# Patient Record
Sex: Female | Born: 1955 | Hispanic: No | Marital: Married | Smoking: Former smoker
Health system: Southern US, Community
[De-identification: ages and names within clinical notes are randomized; demographics above are authoritative.]

## PROBLEM LIST (undated history)

## (undated) DIAGNOSIS — M549 Dorsalgia, unspecified: Secondary | ICD-10-CM

## (undated) DIAGNOSIS — M21611 Bunion of right foot: Secondary | ICD-10-CM

## (undated) DIAGNOSIS — I10 Essential (primary) hypertension: Secondary | ICD-10-CM

## (undated) DIAGNOSIS — I82409 Acute embolism and thrombosis of unspecified deep veins of unspecified lower extremity: Secondary | ICD-10-CM

## (undated) DIAGNOSIS — K219 Gastro-esophageal reflux disease without esophagitis: Secondary | ICD-10-CM

## (undated) DIAGNOSIS — M1990 Unspecified osteoarthritis, unspecified site: Secondary | ICD-10-CM

## (undated) HISTORY — PX: OTHER SURGICAL HISTORY: SHX169

---

## 2000-07-23 ENCOUNTER — Other Ambulatory Visit: Admission: RE | Admit: 2000-07-23 | Discharge: 2000-07-23 | Payer: Self-pay | Admitting: Gynecology

## 2001-12-16 ENCOUNTER — Other Ambulatory Visit: Admission: RE | Admit: 2001-12-16 | Discharge: 2001-12-16 | Payer: Self-pay | Admitting: Gynecology

## 2003-04-30 ENCOUNTER — Other Ambulatory Visit: Admission: RE | Admit: 2003-04-30 | Discharge: 2003-04-30 | Payer: Self-pay | Admitting: Gynecology

## 2010-10-04 ENCOUNTER — Other Ambulatory Visit: Payer: Self-pay | Admitting: Orthopedic Surgery

## 2010-10-04 ENCOUNTER — Ambulatory Visit (HOSPITAL_BASED_OUTPATIENT_CLINIC_OR_DEPARTMENT_OTHER)
Admission: RE | Admit: 2010-10-04 | Discharge: 2010-10-04 | Disposition: A | Discharge status: Home / Self Care | Payer: Managed Care, Other (non HMO) | Source: Ambulatory Visit | Attending: Orthopedic Surgery | Admitting: Orthopedic Surgery

## 2010-10-04 DIAGNOSIS — Z01812 Encounter for preprocedural laboratory examination: Secondary | ICD-10-CM | POA: Insufficient documentation

## 2010-10-04 DIAGNOSIS — K219 Gastro-esophageal reflux disease without esophagitis: Secondary | ICD-10-CM | POA: Insufficient documentation

## 2010-10-04 DIAGNOSIS — M21619 Bunion of unspecified foot: Secondary | ICD-10-CM | POA: Insufficient documentation

## 2010-10-04 DIAGNOSIS — Z01810 Encounter for preprocedural cardiovascular examination: Secondary | ICD-10-CM | POA: Insufficient documentation

## 2010-10-04 DIAGNOSIS — Q66219 Congenital metatarsus primus varus, unspecified foot: Secondary | ICD-10-CM | POA: Insufficient documentation

## 2010-10-04 DIAGNOSIS — G5760 Lesion of plantar nerve, unspecified lower limb: Secondary | ICD-10-CM | POA: Insufficient documentation

## 2010-10-04 DIAGNOSIS — M545 Low back pain, unspecified: Secondary | ICD-10-CM | POA: Insufficient documentation

## 2010-10-04 DIAGNOSIS — M2010 Hallux valgus (acquired), unspecified foot: Secondary | ICD-10-CM | POA: Insufficient documentation

## 2010-10-04 HISTORY — PX: EXCISION MORTON'S NEUROMA: SHX5013

## 2010-10-04 HISTORY — PX: BUNIONECTOMY: SHX129

## 2010-10-11 NOTE — Op Note (Signed)
NAMEJAMALA, KOHEN               ACCOUNT NO.:  [id number]  MEDICAL RECORD NO.:  [id number]  LOCATION:                               FACILITY:  [facility]  PHYSICIAN:  Marlowe Kays, M.D.       DATE OF BIRTH:  DATE OF PROCEDURE:  10/04/2010 DATE OF DISCHARGE:                              OPERATIVE REPORT   PREOPERATIVE DIAGNOSES: 1. Morton neuroma third-fourth webspace. 2. Painful bunion with metatarsus primus varus and hallux valgus     deformity, all left foot.  POSTOPERATIVE DIAGNOSES: 1. Morton neuroma third-fourth webspace. 2. Painful bunion with metatarsus primus varus and hallux valgus     deformity, all left foot.  OPERATION: 1. Excision of Morton neuroma, third and fourth webspace. 2. FUNK bunionectomy, left foot.  SURGEON:  Marlowe Kays, M.D.  ASSISTANT:  None.  ANESTHESIA:  General.  PATHOLOGY AND JUSTIFICATION FOR PROCEDURE:  Clinically, she had a Morton neuroma third and fourth webspace with marked tenderness in this webspace, none in the second and third.  With her foot, she had a very painful prominent bunion with slightly over 15 degrees of metatarsus primus varus and some slight hallux valgus deformity.  PROCEDURE:  Prophylactic antibiotics, satisfied general anesthesia, pneumatic tourniquet.  Left leg was Esmarch out non-sterilely and tourniquet inflated to 350 mmHg.  Latex precautions utilized.  Left foot and leg were prepped to the midcalf with DuraPrep, draped in sterile field.  I first made a dorsal web-splitting incision to the third and fourth webspace and with blunt section, found a large neuroma present, which I grasped with Allis clamp and freed up the branches into the adjacent toes and then cut the common digital nerve in between the third and fourth metatarsal heads.  There was no apparent bleeding at this time.  I then left the wound open and made a medial incision over the distal first metatarsal extending over the proximal phalanx of  the great toe.  Took care to protect the dorsal sensory nerve.  The capsule was isolated and opened with a flap based distally.  Capsule was quite thin and there was a good bit of synovitis over the dorsum of the metatarsal head, which had some erosion.  With a microsaw, I made my first bunionectomy pass and then trimmed this up with rongeurs, both on the underneath surface and dorsally over the first metatarsal head.  I then measured a centimeter from the articular surface and made a scribe line on the cut bunion bone and then measured a 6 mm proximally.  On the distal cut, I made a transverse osteotomy with a microsaw protecting the overlying-underlying tissues leaving the lateral cortex intact and then made an oblique.  Cut used in a second scribe line removing the small wedge of bone.  I then perforated the lateral cortex with a tiny osteotome and cracked the osteotomy down closing the wedge and just corrected the valgus of the great toe.  I placed some small pieces of cancellous bone in the osteotomy site and after irrigating the wound gently to preserve the bone, closed the flap with a toe and corrected the position with multiple interrupted 0 Vicryl.  I infiltrated all  soft tissues with 0.5% plain Marcaine and closed the skin subcutaneous tissue with interrupted 4-0 nylon mattress sutures.  I then released the tourniquet.  There was no unusual bleeding from a Morton neuroma excision.  I closed this with interrupted 3-0 Vicryl in the subcutaneous tissue and interrupted 4 nylon simple mattress sutures in the skin. Betadine, Adaptic, dry sterile dressing were applied, followed by a well- padded sterile tongue blade along the great toe and first metatarsal. Then finished the dressing with Kling and Coban.  She tolerated the procedure well, was taken to recovery in satisfactory condition with no known complications.          ______________________________ Marlowe Kays,  M.D.     JA/MEDQ  D:  10/04/2010  T:  10/05/2010  Job:  [id number]  Electronically Signed by Marlowe Kays M.D. on 10/11/2010 07:19:50 AM

## 2011-01-04 ENCOUNTER — Other Ambulatory Visit: Payer: Self-pay | Admitting: Physician Assistant

## 2011-01-12 ENCOUNTER — Encounter (HOSPITAL_BASED_OUTPATIENT_CLINIC_OR_DEPARTMENT_OTHER): Payer: Self-pay | Admitting: *Deleted

## 2011-01-12 NOTE — Progress Notes (Signed)
NPO AFTER MN. PT ARRIVES AT 0915. NEEDS HG.

## 2011-01-18 ENCOUNTER — Encounter (HOSPITAL_BASED_OUTPATIENT_CLINIC_OR_DEPARTMENT_OTHER): Payer: Self-pay | Admitting: Anesthesiology

## 2011-01-18 ENCOUNTER — Ambulatory Visit (HOSPITAL_BASED_OUTPATIENT_CLINIC_OR_DEPARTMENT_OTHER): Payer: Managed Care, Other (non HMO) | Admitting: Anesthesiology

## 2011-01-18 ENCOUNTER — Encounter (HOSPITAL_BASED_OUTPATIENT_CLINIC_OR_DEPARTMENT_OTHER): Payer: Self-pay | Admitting: *Deleted

## 2011-01-18 ENCOUNTER — Encounter (HOSPITAL_BASED_OUTPATIENT_CLINIC_OR_DEPARTMENT_OTHER)
Admission: RE | Disposition: A | Discharge status: Home / Self Care | Payer: Self-pay | Source: Ambulatory Visit | Attending: Orthopedic Surgery

## 2011-01-18 ENCOUNTER — Ambulatory Visit (HOSPITAL_BASED_OUTPATIENT_CLINIC_OR_DEPARTMENT_OTHER)
Admission: RE | Admit: 2011-01-18 | Discharge: 2011-01-18 | Disposition: A | Discharge status: Home / Self Care | Payer: Managed Care, Other (non HMO) | Source: Ambulatory Visit | Attending: Orthopedic Surgery | Admitting: Orthopedic Surgery

## 2011-01-18 DIAGNOSIS — M21619 Bunion of unspecified foot: Secondary | ICD-10-CM | POA: Insufficient documentation

## 2011-01-18 DIAGNOSIS — Z9889 Other specified postprocedural states: Secondary | ICD-10-CM

## 2011-01-18 DIAGNOSIS — K219 Gastro-esophageal reflux disease without esophagitis: Secondary | ICD-10-CM | POA: Insufficient documentation

## 2011-01-18 HISTORY — PX: BUNIONECTOMY: SHX129

## 2011-01-18 HISTORY — DX: Dorsalgia, unspecified: M54.9

## 2011-01-18 HISTORY — DX: Unspecified osteoarthritis, unspecified site: M19.90

## 2011-01-18 HISTORY — DX: Bunion of right foot: M21.611

## 2011-01-18 HISTORY — DX: Gastro-esophageal reflux disease without esophagitis: K21.9

## 2011-01-18 LAB — POCT HEMOGLOBIN-HEMACUE: Hemoglobin: 14.5 g/dL (ref 12.0–15.0)

## 2011-01-18 SURGERY — BUNIONECTOMY
Anesthesia: General | Site: Foot | Laterality: Right | Wound class: Clean

## 2011-01-18 MED ORDER — LIDOCAINE HCL (CARDIAC) 20 MG/ML IV SOLN
INTRAVENOUS | Status: DC | PRN
  Administered 2011-01-18: 100 mg via INTRAVENOUS

## 2011-01-18 MED ORDER — FENTANYL CITRATE 0.05 MG/ML IJ SOLN
INTRAMUSCULAR | Status: DC | PRN
  Administered 2011-01-18: 100 ug via INTRAVENOUS
  Administered 2011-01-18 (×2): 50 ug via INTRAVENOUS

## 2011-01-18 MED ORDER — LACTATED RINGERS IV SOLN: INTRAVENOUS | Status: DC

## 2011-01-18 MED ORDER — SODIUM CHLORIDE 0.9 % IV SOLN: INTRAVENOUS | Status: DC

## 2011-01-18 MED ORDER — FENTANYL CITRATE 0.05 MG/ML IJ SOLN: 25.0000 ug | INTRAMUSCULAR | Status: DC | PRN

## 2011-01-18 MED ORDER — PROMETHAZINE HCL 25 MG/ML IJ SOLN: 6.2500 mg | INTRAMUSCULAR | Status: DC | PRN

## 2011-01-18 MED ORDER — OXYCODONE-ACETAMINOPHEN 7.5-325 MG PO TABS: 1.0000 | ORAL_TABLET | ORAL | Status: AC | PRN

## 2011-01-18 MED ORDER — ONDANSETRON HCL 4 MG/2ML IJ SOLN
INTRAMUSCULAR | Status: DC | PRN
  Administered 2011-01-18: 4 mg via INTRAVENOUS

## 2011-01-18 MED ORDER — LACTATED RINGERS IV SOLN
INTRAVENOUS | Status: DC
  Administered 2011-01-18: 100 mL/h via INTRAVENOUS

## 2011-01-18 MED ORDER — MIDAZOLAM HCL 5 MG/5ML IJ SOLN
INTRAMUSCULAR | Status: DC | PRN
  Administered 2011-01-18: 2 mg via INTRAVENOUS

## 2011-01-18 MED ORDER — DEXAMETHASONE SODIUM PHOSPHATE 4 MG/ML IJ SOLN
INTRAMUSCULAR | Status: DC | PRN
  Administered 2011-01-18: 4 mg via INTRAVENOUS

## 2011-01-18 MED ORDER — LACTATED RINGERS IV SOLN
INTRAVENOUS | Status: DC | PRN
  Administered 2011-01-18: 10:00:00 via INTRAVENOUS

## 2011-01-18 MED ORDER — KETOROLAC TROMETHAMINE 30 MG/ML IJ SOLN
INTRAMUSCULAR | Status: DC | PRN
  Administered 2011-01-18: 30 mg via INTRAVENOUS

## 2011-01-18 MED ORDER — PROPOFOL 10 MG/ML IV EMUL
INTRAVENOUS | Status: DC | PRN
  Administered 2011-01-18: 180 mg via INTRAVENOUS

## 2011-01-18 MED ORDER — BUPIVACAINE HCL (PF) 0.25 % IJ SOLN
INTRAMUSCULAR | Status: DC | PRN
  Administered 2011-01-18: 3 mL

## 2011-01-18 MED ORDER — CHLORHEXIDINE GLUCONATE 4 % EX LIQD: 60.0000 mL | Freq: Once | CUTANEOUS | Status: DC

## 2011-01-18 SURGICAL SUPPLY — 50 items
BANDAGE COBAN STERILE 4 (GAUZE/BANDAGES/DRESSINGS) ×2 IMPLANT
BANDAGE CONFORM 3  STR LF (GAUZE/BANDAGES/DRESSINGS) ×2 IMPLANT
BANDAGE ESMARK 6X9 LF (GAUZE/BANDAGES/DRESSINGS) ×1 IMPLANT
BANDAGE GAUZE ELAST BULKY 4 IN (GAUZE/BANDAGES/DRESSINGS) IMPLANT
BLADE FLAT COURSE (BLADE) ×2 IMPLANT
BLADE FLAT FINE (BLADE) ×2 IMPLANT
BLADE SURG 15 STRL LF DISP TIS (BLADE) ×2 IMPLANT
BLADE SURG 15 STRL SS (BLADE) ×2
BNDG COHESIVE 3X5 TAN STRL LF (GAUZE/BANDAGES/DRESSINGS) IMPLANT
BNDG COHESIVE 4X5 TAN NS LF (GAUZE/BANDAGES/DRESSINGS) IMPLANT
BNDG ESMARK 6X9 LF (GAUZE/BANDAGES/DRESSINGS) ×2
CLOTH BEACON ORANGE TIMEOUT ST (SAFETY) ×2 IMPLANT
CORDS BIPOLAR (ELECTRODE) ×2 IMPLANT
COVER TABLE BACK 60X90 (DRAPES) ×2 IMPLANT
DEPRESSOR TONGUE BLADE STERILE (MISCELLANEOUS) ×2 IMPLANT
DRAPE EXTREMITY T 121X128X90 (DRAPE) ×2 IMPLANT
DRAPE LG THREE QUARTER DISP (DRAPES) ×2 IMPLANT
DRAPE U-SHAPE 47X51 STRL (DRAPES) ×2 IMPLANT
DRSG EMULSION OIL 3X3 NADH (GAUZE/BANDAGES/DRESSINGS) ×2 IMPLANT
DURAPREP 26ML APPLICATOR (WOUND CARE) ×2 IMPLANT
ELECT REM PT RETURN 9FT ADLT (ELECTROSURGICAL) ×2
ELECTRODE REM PT RTRN 9FT ADLT (ELECTROSURGICAL) ×1 IMPLANT
GAUZE KERLIX 2  STERILE LF (GAUZE/BANDAGES/DRESSINGS) ×2 IMPLANT
GLOVE ECLIPSE 8.0 STRL XLNG CF (GLOVE) IMPLANT
GLOVE INDICATOR 6.5 STRL GRN (GLOVE) ×4 IMPLANT
GLOVE INDICATOR 8.0 STRL GRN (GLOVE) ×4 IMPLANT
GOWN PREVENTION PLUS LG XLONG (DISPOSABLE) ×2 IMPLANT
GOWN STRL REIN XL XLG (GOWN DISPOSABLE) ×2 IMPLANT
LOOP VESSEL MAXI BLUE (MISCELLANEOUS) IMPLANT
NEEDLE HYPO 22GX1.5 SAFETY (NEEDLE) ×2 IMPLANT
NEEDLE KEITH (NEEDLE) IMPLANT
NS IRRIG 500ML POUR BTL (IV SOLUTION) ×2 IMPLANT
PACK BASIN DAY SURGERY FS (CUSTOM PROCEDURE TRAY) ×2 IMPLANT
PAD CAST 4YDX4 CTTN HI CHSV (CAST SUPPLIES) ×1 IMPLANT
PADDING CAST ABS 4INX4YD NS (CAST SUPPLIES) ×1
PADDING CAST ABS COTTON 4X4 ST (CAST SUPPLIES) ×1 IMPLANT
PADDING CAST COTTON 4X4 STRL (CAST SUPPLIES) ×1
PENCIL BUTTON HOLSTER BLD 10FT (ELECTRODE) ×2 IMPLANT
SPONGE GAUZE 4X4 12PLY (GAUZE/BANDAGES/DRESSINGS) ×2 IMPLANT
SPONGE LAP 4X18 X RAY DECT (DISPOSABLE) ×2 IMPLANT
STOCKINETTE 4X48 STRL (DRAPES) IMPLANT
STOCKINETTE 6  STRL (DRAPES) ×1
STOCKINETTE 6 STRL (DRAPES) ×1 IMPLANT
SUCTION FRAZIER TIP 10 FR DISP (SUCTIONS) ×2 IMPLANT
SUT ETHILON 4 0 PS 2 18 (SUTURE) ×4 IMPLANT
SUT VIC AB 0 CT1 36 (SUTURE) ×2 IMPLANT
SYR BULB IRRIGATION 50ML (SYRINGE) ×2 IMPLANT
SYR CONTROL 10ML LL (SYRINGE) ×2 IMPLANT
UNDERPAD 30X30 INCONTINENT (UNDERPADS AND DIAPERS) ×2 IMPLANT
WATER STERILE IRR 500ML POUR (IV SOLUTION) ×2 IMPLANT

## 2011-01-18 NOTE — Anesthesia Preprocedure Evaluation (Addendum)
Anesthesia Evaluation  Patient identified by MRN, date of birth, ID band Patient awake    Reviewed: Allergy & Precautions, H&P , NPO status , Patient's Chart, lab work & pertinent test results  Airway Mallampati: I TM Distance: >3 FB Neck ROM: full    Dental No notable dental hx. (+) Teeth Intact and Dental Advidsory Given   Pulmonary neg pulmonary ROS,  clear to auscultation  Pulmonary exam normal       Cardiovascular Exercise Tolerance: Good neg cardio ROS regular Normal    Neuro/Psych Negative Neurological ROS  Negative Psych ROS   GI/Hepatic negative GI ROS, Neg liver ROS, GERD-  ,  Endo/Other  Negative Endocrine ROS  Renal/GU negative Renal ROS  Genitourinary negative   Musculoskeletal   Abdominal Normal abdominal exam  (+)   Peds  Hematology negative hematology ROS (+)   Anesthesia Other Findings   Reproductive/Obstetrics negative OB ROS                          Anesthesia Physical Anesthesia Plan  ASA: I  Anesthesia Plan: General LMA   Post-op Pain Management:    Induction:   Airway Management Planned:   Additional Equipment:   Intra-op Plan:   Post-operative Plan:   Informed Consent: I have reviewed the patients History and Physical, chart, labs and discussed the procedure including the risks, benefits and alternatives for the proposed anesthesia with the patient or authorized representative who has indicated his/her understanding and acceptance.   Dental Advisory Given  Plan Discussed with: CRNA  Anesthesia Plan Comments:         Anesthesia Quick Evaluation

## 2011-01-18 NOTE — Anesthesia Procedure Notes (Signed)
Procedure Name: LMA Insertion Date/Time: 01/18/2011 10:51 AM Performed by: Iline Oven Pre-anesthesia Checklist: Patient identified, Emergency Drugs available, Suction available and Patient being monitored Patient Re-evaluated:Patient Re-evaluated prior to inductionOxygen Delivery Method: Circle System Utilized Preoxygenation: Pre-oxygenation with 100% oxygen Intubation Type: IV induction Ventilation: Mask ventilation without difficulty LMA: LMA inserted LMA Size: 4.0 Number of attempts: 1 Airway Equipment and Method: bite block Placement Confirmation: positive ETCO2 Tube secured with: Tape Dental Injury: Teeth and Oropharynx as per pre-operative assessment

## 2011-01-18 NOTE — Brief Op Note (Signed)
01/18/2011  11:36 AM  PATIENT:  Mikayla French  55 y.o. female  PRE-OPERATIVE DIAGNOSIS:  BUNION RIGHT FOOT  POST-OPERATIVE DIAGNOSIS:  painful bunion right foot   PROCEDURE:  Procedure(s): Simple Bunionectomy Right foot  SURGEON:  Surgeon(s): James P Aplington  PHYSICIAN ASSISTANT: None  ASSISTANTS: none   ANESTHESIA:   general  EBL:  Total I/O In: 100 [I.V.:100] Out: -   BLOOD ADMINISTERED:none  DRAINS: none   LOCAL MEDICATIONS USED:  MARCAINE 3CC  SPECIMEN:  No Specimen  DISPOSITION OF SPECIMEN:  N/A  COUNTS:  YES  TOURNIQUET:   Total Tourniquet Time Documented: Calf (Right) - 27 minutes  DICTATION: .Other Dictation: Dictation Number [id number]  PLAN OF CARE: Discharge to home after PACU  PATIENT DISPOSITION:  PACU - hemodynamically stable.

## 2011-01-18 NOTE — Anesthesia Postprocedure Evaluation (Signed)
Anesthesia Post Note  Patient: Mikayla French  Procedure(s) Performed:  BUNIONECTOMY  Anesthesia type: General  Patient location: PACU  Post pain: Pain level controlled  Post assessment: Post-op Vital signs reviewed  Last Vitals:  Filed Vitals:   01/18/11 1200  BP: 137/75  Pulse: 70  Temp:   Resp: 8    Post vital signs: Reviewed  Level of consciousness: sedated  Complications: No apparent anesthesia complications

## 2011-01-18 NOTE — H&P (Signed)
Mikayla French is an 55 y.o. female.   Chief Complaint: painful bunion  Rt foot HPI Prominent tender bunion rt foot s/p bunionectomy Lt foot  Past Medical History  Diagnosis Date  . Bunion of right foot   . Back pain occasional    post decompression w/ chiropractor --  w/ good improvement  . Arthritis   . GERD (gastroesophageal reflux disease) occ. reflux    watches diet/  takes pickle juice    Past Surgical History  Procedure Date  . Removal baker's cyst right knee 6 YRS AGO  . Excision morton's neuroma 10-04-2010    LEFT --  THIRD- FOURTH WEBSPACE  . Bunionectomy 10-04-2010    LEFT FOOT    History reviewed. No pertinent family history. Social History:  reports that she quit smoking about 5 years ago. Her smoking use included Cigarettes. She has never used smokeless tobacco. She reports that she drinks alcohol. She reports that she does not use illicit drugs.  Allergies:  Allergies  Allergen Reactions  . Latex Anaphylaxis    blisters  . Adhesive (Tape) Other (See Comments)    blisters  . Ibuprofen Other (See Comments)    Gl bleeding  . Nitrofuran Derivatives Itching  . Penicillins Swelling    Medications Prior to Admission  Medication Dose Route Frequency Provider Last Rate Last Dose  . 0.9 %  sodium chloride infusion   Intravenous Continuous Lottie Dawson III, PA      . chlorhexidine (HIBICLENS) 4 % liquid 4 application  60 mL Topical Once Lottie Dawson III, PA      . lactated ringers infusion   Intravenous Continuous Gaetano Hawthorne, MD 100 mL/hr at 01/18/11 0923 100 mL/hr at 01/18/11 1610   Medications Prior to Admission  Medication Sig Dispense Refill  . Multiple Vitamins-Minerals (MULTI-VITAMIN MENOPAUSAL) TABS Take 2 tablets by mouth once.        . Nutritional Supplements (MELATONIN PO) Take by mouth as needed.          Results for orders placed during the hospital encounter of 01/18/11 (from the past 48 hour(s))  POCT HEMOGLOBIN-HEMACUE     Status:  Normal   Collection Time   01/18/11  9:26 AM      Component Value Range Comment   Hemoglobin 14.5  12.0 - 15.0 (g/dL)    No results found.  ROS  Blood pressure 126/79, pulse 65, temperature 97 F (36.1 C), temperature source Oral, resp. rate 18, height 5\' 5"  (1.651 m), weight 77.111 kg (170 lb), SpO2 96.00%. Physical Exam  Constitutional: She is oriented to person, place, and time. She appears well-developed and well-nourished.  HENT:  Head: Normocephalic and atraumatic.  Right Ear: External ear normal.  Left Ear: External ear normal.  Nose: Nose normal.  Mouth/Throat: Oropharynx is clear and moist.  Eyes: Conjunctivae and EOM are normal. Pupils are equal, round, and reactive to light.  Neck: Normal range of motion. Neck supple.  Cardiovascular: Normal rate, regular rhythm, normal heart sounds and intact distal pulses.   Respiratory: Effort normal and breath sounds normal.  GI: Soft. Bowel sounds are normal.  Musculoskeletal: Normal range of motion. She exhibits tenderness.       Tender bunion rt foot  Neurological: She is alert and oriented to person, place, and time. She has normal reflexes.  Skin: Skin is warm and dry.  Psychiatric: She has a normal mood and affect. Her behavior is normal. Judgment and thought content normal.  Assessment/Plan Painful bunion Rt foot Simple bunionectomy Rt foot  Marland Reine P 01/18/2011, 10:26 AM

## 2011-01-18 NOTE — Transfer of Care (Signed)
Immediate Anesthesia Transfer of Care Note  Patient: Mikayla French  Procedure(s) Performed:  BUNIONECTOMY  Patient Location: PACU  Anesthesia Type: General  Level of Consciousness: awake, sedated, patient cooperative and responds to stimulation  Airway & Oxygen Therapy: Patient Spontanous Breathing and Patient connected to face mask oxygen  Post-op Assessment: Report given to PACU RN, Post -op Vital signs reviewed and stable and Patient moving all extremities  Post vital signs: Reviewed and stable  Complications: No apparent anesthesia complications

## 2011-01-19 ENCOUNTER — Encounter (HOSPITAL_BASED_OUTPATIENT_CLINIC_OR_DEPARTMENT_OTHER): Payer: Self-pay | Admitting: Orthopedic Surgery

## 2011-01-19 NOTE — Op Note (Signed)
Mikayla French, VINLUAN               ACCOUNT NO.:  [id number]  MEDICAL RECORD NO.:  [id number]  LOCATION:                               FACILITY:  [facility]  PHYSICIAN:  Marlowe Kays, M.D.  DATE OF BIRTH:  [birth year]  DATE OF PROCEDURE:  01/18/2011 DATE OF DISCHARGE:                              OPERATIVE REPORT   PREOPERATIVE DIAGNOSIS:  Painful bunion, right foot.  POSTOPERATIVE DIAGNOSIS:  Painful bunion, right foot.  OPERATION:  Simple bunionectomy, right foot.  SURGEON:  Marlowe Kays, M.D.  ASSISTANT:  Nurse.  ANESTHESIA:  General.  PATHOLOGIC JUSTIFICATION FOR PROCEDURE:  She has had a successful bunionectomy on the left foot; has a bunion deformity on the right foot, which is giving her pain and problems with shoe wearing, She does not have metatarsus primus varus in her right foot.  It was felt that a simple bunionectomy would be the treatment of choice.  PROCEDURE IN DETAIL:  Satisfied general anesthesia, pneumatic tourniquet, right foot and ankle were prepped with DuraPrep, draped in a sterile field, latex precautions, time-out performed.  Tourniquet was inflated after elevating the leg for about 5 minutes.  I made a dorsomedial incision, centered over the bunion, but extending proximally and distalward.  The underlying capsule of the MP joint was identified and.  Dorsal sensory nerve was protected.  The capsule was opened the flap base distally to expose both the bunion and a small dorsal component.  I excised the bunion and the dorsal component with a combination of osteotome and rongeur until she had a nice visual correction.  Wound was irrigated with sterile saline and soft tissues infiltrated with 0.25% Marcaine plain.  I then closed the wound with the great toe in a corrected position and tightening the flap proximalward with interrupted 0 Vicryl suture.  Skin and subcutaneous tissue were closed with interrupted 4-0 nylon mattress sutures.  Betadine,  Adaptic, dry sterile dressing were applied.  Tourniquet was released.  She tolerated the procedure well.  At the time of this dictation, she was on her way to the recovery room in satisfactory condition with no known complications and no blood loss.         ______________________________ Marlowe Kays, M.D.    JA/MEDQ  D:  01/18/2011  T:  01/19/2011  Job:  [id number]

## 2011-01-31 DIAGNOSIS — R319 Hematuria, unspecified: Secondary | ICD-10-CM | POA: Insufficient documentation

## 2011-03-28 DIAGNOSIS — N390 Urinary tract infection, site not specified: Secondary | ICD-10-CM | POA: Insufficient documentation

## 2016-02-07 DIAGNOSIS — S83231A Complex tear of medial meniscus, current injury, right knee, initial encounter: Secondary | ICD-10-CM | POA: Insufficient documentation

## 2017-04-21 DIAGNOSIS — E039 Hypothyroidism, unspecified: Secondary | ICD-10-CM | POA: Insufficient documentation

## 2018-01-25 DIAGNOSIS — J309 Allergic rhinitis, unspecified: Secondary | ICD-10-CM | POA: Insufficient documentation

## 2018-01-25 DIAGNOSIS — N809 Endometriosis, unspecified: Secondary | ICD-10-CM | POA: Insufficient documentation

## 2018-01-25 DIAGNOSIS — IMO0002 Reserved for concepts with insufficient information to code with codable children: Secondary | ICD-10-CM | POA: Insufficient documentation

## 2018-03-19 DIAGNOSIS — H02834 Dermatochalasis of left upper eyelid: Secondary | ICD-10-CM | POA: Insufficient documentation

## 2018-03-19 DIAGNOSIS — H04123 Dry eye syndrome of bilateral lacrimal glands: Secondary | ICD-10-CM | POA: Insufficient documentation

## 2018-03-19 DIAGNOSIS — H02831 Dermatochalasis of right upper eyelid: Secondary | ICD-10-CM | POA: Insufficient documentation

## 2018-03-19 DIAGNOSIS — H02883 Meibomian gland dysfunction of right eye, unspecified eyelid: Secondary | ICD-10-CM | POA: Insufficient documentation

## 2018-03-19 DIAGNOSIS — H2513 Age-related nuclear cataract, bilateral: Secondary | ICD-10-CM | POA: Insufficient documentation

## 2018-03-19 DIAGNOSIS — E119 Type 2 diabetes mellitus without complications: Secondary | ICD-10-CM | POA: Insufficient documentation

## 2018-03-19 DIAGNOSIS — H35033 Hypertensive retinopathy, bilateral: Secondary | ICD-10-CM | POA: Insufficient documentation

## 2018-03-19 DIAGNOSIS — H0100A Unspecified blepharitis right eye, upper and lower eyelids: Secondary | ICD-10-CM | POA: Insufficient documentation

## 2018-05-24 DIAGNOSIS — R7989 Other specified abnormal findings of blood chemistry: Secondary | ICD-10-CM | POA: Insufficient documentation

## 2018-09-22 ENCOUNTER — Emergency Department (INDEPENDENT_AMBULATORY_CARE_PROVIDER_SITE_OTHER)
Admission: EM | Admit: 2018-09-22 | Discharge: 2018-09-22 | Disposition: A | Discharge status: Home / Self Care | Source: Home / Self Care | Attending: Family Medicine | Admitting: Family Medicine

## 2018-09-22 ENCOUNTER — Other Ambulatory Visit: Payer: Self-pay

## 2018-09-22 DIAGNOSIS — R300 Dysuria: Secondary | ICD-10-CM

## 2018-09-22 DIAGNOSIS — N3090 Cystitis, unspecified without hematuria: Secondary | ICD-10-CM

## 2018-09-22 LAB — POCT URINALYSIS DIP (MANUAL ENTRY)
Bilirubin, UA: NEGATIVE
Glucose, UA: NEGATIVE mg/dL
Ketones, POC UA: NEGATIVE mg/dL
Nitrite, UA: POSITIVE — AB
Spec Grav, UA: 1.025 (ref 1.010–1.025)
Urobilinogen, UA: 0.2 E.U./dL
pH, UA: 5.5 (ref 5.0–8.0)

## 2018-09-22 MED ORDER — CIPROFLOXACIN HCL 500 MG PO TABS: 500.0000 mg | ORAL_TABLET | Freq: Two times a day (BID) | ORAL | 0 refills | Status: DC

## 2018-09-22 NOTE — ED Triage Notes (Signed)
Pt c/o burning with urination as well more frequency with difficulty completely voiding bladder x 3 days.

## 2018-09-22 NOTE — ED Provider Notes (Signed)
[facility]    CSN: [id number] Arrival date & time: 09/22/18  1413      History   Chief Complaint Chief Complaint  Patient presents with  . Dysuria  . Urinary Frequency    HPI Mikayla French is a 63 y.o. female.   Patient complains of four day history of dysuria, frequency, hesitancy, and urgency.  She denies abdominal/pelvic pain and no fevers, chills, and sweats.  The history is provided by the patient.  Dysuria Pain quality:  Burning Pain severity:  Mild Onset quality:  Sudden Duration:  4 days Timing:  Constant Progression:  Worsening Chronicity:  New Recent urinary tract infections: no   Relieved by:  None tried Worsened by:  Nothing Ineffective treatments:  None tried Urinary symptoms: frequent urination and hesitancy   Urinary symptoms: no discolored urine, no foul-smelling urine, no hematuria and no bladder incontinence   Associated symptoms: no abdominal pain, no fever, no flank pain, no nausea and no vaginal discharge     Past Medical History:  Diagnosis Date  . Arthritis   . Back pain occasional   post decompression w/ chiropractor --  w/ good improvement  . Bunion of right foot   . GERD (gastroesophageal reflux disease) occ. reflux   watches diet/  takes pickle juice    There are no active problems to display for this patient.   Past Surgical History:  Procedure Laterality Date  . BUNIONECTOMY  10-04-2010   LEFT FOOT  . BUNIONECTOMY  01/18/2011   Procedure: Arbutus LeasBUNIONECTOMY;  Surgeon: Illene LabradorJames P Aplington;  Location: [facility];  Service: Orthopedics;  Laterality: Right;  . EXCISION MORTON'S NEUROMA  10-04-2010   LEFT --  THIRD- FOURTH WEBSPACE  . REMOVAL BAKER'S CYST RIGHT KNEE  6 YRS AGO    OB History   No obstetric history on file.      Home Medications    Prior to Admission medications   Medication Sig Start Date End Date Taking? Authorizing Provider  ciprofloxacin (CIPRO) 500 MG tablet Take 1 tablet (500 mg  total) by mouth 2 (two) times daily. 09/22/18   Lattie HawBeese, Yousaf Sainato A, MD  Multiple Vitamins-Minerals (MULTI-VITAMIN MENOPAUSAL) TABS Take 2 tablets by mouth once.      [provider]  Nutritional Supplements (MELATONIN PO) Take by mouth as needed.      [provider]    Family History History reviewed. No pertinent family history.  Social History Social History   Tobacco Use  . Smoking status: Former Smoker    Types: Cigarettes    Quit date: 01/11/2006    Years since quitting: 12.7  . Smokeless tobacco: Never Used  Substance Use Topics  . Alcohol use: Yes    Comment: 1 WINE/ DAILY  . Drug use: No     Allergies   Latex, Adhesive [tape], Ibuprofen, Nitrofuran derivatives, and Penicillins   Review of Systems Review of Systems  Constitutional: Negative for fever.  Gastrointestinal: Negative for abdominal pain and nausea.  Genitourinary: Positive for dysuria, frequency and urgency. Negative for decreased urine volume, difficulty urinating, flank pain, hematuria, pelvic pain and vaginal discharge.  All other systems reviewed and are negative.    Physical Exam Triage Vital Signs ED Triage Vitals  Enc Vitals Group     BP 09/22/18 1455 124/80     Pulse Rate 09/22/18 1455 86     Resp 09/22/18 1455 18     Temp 09/22/18 1455 (!) 97.4 F (36.3 C)  Temp Source 09/22/18 1455 Oral     SpO2 09/22/18 1455 95 %     Weight 09/22/18 1457 179 lb (81.2 kg)     Height 09/22/18 1457 5\' 5"  (1.651 m)     Head Circumference --      Peak Flow --      Pain Score 09/22/18 1457 0     Pain Loc --      Pain Edu? --      Excl. in [place]? --    No data found.  Updated Vital Signs BP 124/80 (BP Location: Right Arm)   Pulse 86   Temp (!) 97.4 F (36.3 C) (Oral)   Resp 18   Ht 5\' 5"  (1.651 m)   Wt 81.2 kg   SpO2 95%   BMI 29.79 kg/m   Visual Acuity Right Eye Distance:   Left Eye Distance:   Bilateral Distance:    Right Eye Near:   Left Eye Near:    Bilateral Near:      Physical Exam Nursing notes and Vital Signs reviewed. Appearance:  Patient appears stated age, and in no acute distress.    Eyes:  Pupils are equal, round, and reactive to light and accomodation.  Extraocular movement is intact.  Conjunctivae are not inflamed   Pharynx:  Normal; moist mucous membranes  Neck:  Supple.  No adenopathy Lungs:  Clear to auscultation.  Breath sounds are equal.  Moving air well. Heart:  Regular rate and rhythm without murmurs, rubs, or gallops.  Abdomen:  Nontender without masses or hepatosplenomegaly.  Bowel sounds are present.  No CVA or flank tenderness.  Extremities:  No edema.  Skin:  No rash present.     UC Treatments / Results  Labs (all labs ordered are listed, but only abnormal results are displayed) Labs Reviewed  POCT URINALYSIS DIP (MANUAL ENTRY) - Abnormal; Notable for the following components:      Result Value   Clarity, UA cloudy (*)    Blood, UA small (*)    Protein Ur, POC trace (*)    Nitrite, UA Positive (*)    Leukocytes, UA Large (3+) (*)    All other components within normal limits  URINE CULTURE    EKG   Radiology No results found.  Procedures Procedures (including critical care time)  Medications Ordered in UC Medications - No data to display  Initial Impression / Assessment and Plan / UC Course  I have reviewed the triage vital signs and the nursing notes.  Pertinent labs & imaging results that were available during my care of the patient were reviewed by me and considered in my medical decision making (see chart for details).    Begin Cipro.  Urine culture pending. Followup with Family Doctor if not improved in 10 days.   Final Clinical Impressions(s) / UC Diagnoses   Final diagnoses:  Dysuria  Cystitis     Discharge Instructions     Increase fluid intake. May use non-prescription AZO for about two days, if desired, to decrease urinary discomfort.  If symptoms become significantly worse during the  night or over the weekend, proceed to the local emergency room.     ED Prescriptions    Medication Sig Dispense Auth. Provider   ciprofloxacin (CIPRO) 500 MG tablet Take 1 tablet (500 mg total) by mouth 2 (two) times daily. 20 tablet Kandra Nicolas, MD        Kandra Nicolas, MD 09/23/18 [id number]

## 2018-09-22 NOTE — Discharge Instructions (Addendum)
Increase fluid intake. May use non-prescription AZO for about two days, if desired, to decrease urinary discomfort.  If symptoms become significantly worse during the night or over the weekend, proceed to the local emergency room.  

## 2018-09-24 ENCOUNTER — Telehealth (HOSPITAL_COMMUNITY): Payer: Self-pay | Admitting: Emergency Medicine

## 2018-09-24 LAB — URINE CULTURE
MICRO NUMBER:: 828560
SPECIMEN QUALITY:: ADEQUATE

## 2018-09-24 NOTE — Telephone Encounter (Signed)
Urine culture was positive for e coli and was given cipro  at urgent care visit. Pt contacted and made aware, educated on completing antibiotic and to follow up if symptoms are persistent. Verbalized understanding.    

## 2018-10-11 DIAGNOSIS — G629 Polyneuropathy, unspecified: Secondary | ICD-10-CM | POA: Insufficient documentation

## 2019-08-28 ENCOUNTER — Ambulatory Visit (INDEPENDENT_AMBULATORY_CARE_PROVIDER_SITE_OTHER): Admitting: Osteopathic Medicine

## 2019-08-28 ENCOUNTER — Encounter: Payer: Self-pay | Admitting: Osteopathic Medicine

## 2019-08-28 ENCOUNTER — Other Ambulatory Visit: Payer: Self-pay

## 2019-08-28 ENCOUNTER — Ambulatory Visit (INDEPENDENT_AMBULATORY_CARE_PROVIDER_SITE_OTHER)

## 2019-08-28 VITALS — BP 118/76 | HR 75 | Ht 67.0 in | Wt 181.0 lb

## 2019-08-28 DIAGNOSIS — M545 Low back pain, unspecified: Secondary | ICD-10-CM

## 2019-08-28 DIAGNOSIS — M546 Pain in thoracic spine: Secondary | ICD-10-CM | POA: Diagnosis not present

## 2019-08-28 DIAGNOSIS — G8929 Other chronic pain: Secondary | ICD-10-CM

## 2019-08-28 DIAGNOSIS — Z87891 Personal history of nicotine dependence: Secondary | ICD-10-CM

## 2019-08-28 DIAGNOSIS — R7303 Prediabetes: Secondary | ICD-10-CM | POA: Diagnosis not present

## 2019-08-28 DIAGNOSIS — E039 Hypothyroidism, unspecified: Secondary | ICD-10-CM | POA: Diagnosis not present

## 2019-08-28 NOTE — Progress Notes (Signed)
Mikayla French is a 64 y.o. female who presents to  [facility] at [facility]  today, 08/28/19, seeking care for the following: New to establish care  . Mouth broken out after Cipro Rx for UTI, seems to have resolved at this point . Back pain - mid-back and lumbar area, has been seeing chiropractor, using a decompression device which was helpful up until a couple of months ago.  Requests x-rays, patient does not think that she would be adherent to PT regimen . Thyroid problem - see problem list  . Hx DM2 on chart - last A1C available for review was 6.0 06/05/19, prior to that 5.7 four years ago      ASSESSMENT & PLAN with other pertinent findings:  The primary encounter diagnosis was Chronic bilateral low back pain without sciatica. Diagnoses of Chronic bilateral thoracic back pain, Acquired hypothyroidism, Prediabetes, and Former smoker were also pertinent to this visit.   No red flag symptoms for back pain particularly no significant radiculopathy, incontinence, falls/weakness.  Normal exam, including negative straight leg raise bilaterally.  X-rays as below, advised okay to continue chiropractics, recommended PT  No results found for this or any previous visit (from the past 24 hour(s)).   There are no Patient Instructions on file for this visit.  Orders Placed This Encounter  Procedures  . DG Thoracic Spine W/Swimmers  . DG Lumbar Spine Complete  . DG Si Joints  . CT CHEST LUNG CA SCREEN LOW DOSE W/O CM    No orders of the defined types were placed in this encounter.      Follow-up instructions: Return for RECHECK PENDING RESULTS / IF WORSE OR CHANGE. IF FEELING OK, ANNUAL CHECK-UP IN 1 YEAR .                                         BP 118/76 (BP Location: Left Arm, Patient Position: Sitting)   Pulse 75   Ht 5\' 7"  (1.702 m)   Wt 181 lb (82.1 kg)   SpO2 98%   BMI 28.35 kg/m   Current  Meds  Medication Sig  . ACIDOPHILUS LACTOBACILLUS PO Take by mouth.  b complex vitamins capsule Take by mouth.  . Cholecalciferol 1.25 MG (50000 UT) TABS Take by mouth.  . clobetasol cream (TEMOVATE) 0.05 % Use as directed bid  . conjugated estrogens (PREMARIN) vaginal cream USE ONE-HALF (1/2) GRAM VAGINALLY 2 TO 3 NIGHTS A WEEK  . Multiple Vitamin (MULTIVITAMIN) capsule Take 1 capsule by mouth every morning.  . Multiple Vitamins-Minerals (MULTI-VITAMIN MENOPAUSAL) TABS Take 2 tablets by mouth once.    . NP THYROID 30 MG tablet Take 30 mg by mouth daily.  . Nutritional Supplements (MELATONIN PO) Take by mouth as needed.    . phentermine (ADIPEX-P) 37.5 MG tablet Take by mouth.  . Pregnenolone POWD by Does not apply route.  Marland Kitchen PREMARIN vaginal cream Place vaginally.  Marland Kitchen thyroid (ARMOUR) 15 MG tablet Take one (1) 15 mg + one (1) 30 mg tablet for (45 mg Total) QOD  Alternating with (30 mg Total) QOD  . topiramate (TOPAMAX) 25 MG tablet Take by mouth.    No results found for this or any previous visit (from the past 72 hour(s)).  DG Thoracic Spine W/Swimmers  Result Date: 08/28/2019 CLINICAL DATA:  Back pain EXAM: THORACIC SPINE - 3 VIEWS COMPARISON:  None. FINDINGS: There is no evidence of thoracic spine fracture. Alignment is normal. Very mild multilevel endplate sclerosis is seen with mild multilevel intervertebral disc space narrowing. IMPRESSION: Mild multilevel degenerative changes. Electronically Signed   By: Aram Candela M.D.   On: 08/28/2019 21:04   DG Lumbar Spine Complete  Result Date: 08/28/2019 CLINICAL DATA:  Back pain. EXAM: LUMBAR SPINE - COMPLETE 4+ VIEW COMPARISON:  None. FINDINGS: There is no evidence of lumbar spine fracture. Alignment is normal. Mild to moderate severity endplate sclerosis is seen at the levels of L3-L4 and L4-L5. Mild to moderate severity intervertebral disc space narrowing is seen at the level of L3-L4. IMPRESSION: Mild to moderate severity  degenerative disc disease at L3-L4 and L4-L5. Electronically Signed   By: Aram Candela M.D.   On: 08/28/2019 21:06   DG Si Joints  Result Date: 08/28/2019 CLINICAL DATA:  Chronic back pain. EXAM: BILATERAL SACROILIAC JOINTS - 3+ VIEW COMPARISON:  None. FINDINGS: Mild degenerative changes seen involving the bilateral sacroiliac joints, most notably in the form of periarticular sclerosis. No other bone abnormalities are seen. IMPRESSION: Mild degenerative changes involving the bilateral sacroiliac joints. Electronically Signed   By: Aram Candela M.D.   On: 08/28/2019 21:07       All questions at time of visit were answered - patient instructed to contact office with any additional concerns or updates.  ER/RTC precautions were reviewed with the patient as applicable.   Please note: voice recognition software was used to produce this document, and typos may escape review. Please contact Dr. Lyn Hollingshead for any needed clarifications.

## 2019-08-29 DIAGNOSIS — Z87891 Personal history of nicotine dependence: Secondary | ICD-10-CM | POA: Insufficient documentation

## 2019-08-29 DIAGNOSIS — R7303 Prediabetes: Secondary | ICD-10-CM | POA: Insufficient documentation

## 2019-09-03 ENCOUNTER — Other Ambulatory Visit: Payer: Self-pay

## 2019-09-03 ENCOUNTER — Ambulatory Visit (INDEPENDENT_AMBULATORY_CARE_PROVIDER_SITE_OTHER)

## 2019-09-03 DIAGNOSIS — Z87891 Personal history of nicotine dependence: Secondary | ICD-10-CM

## 2019-09-11 ENCOUNTER — Encounter: Payer: Self-pay | Admitting: Gastroenterology

## 2019-09-11 ENCOUNTER — Ambulatory Visit (INDEPENDENT_AMBULATORY_CARE_PROVIDER_SITE_OTHER): Admitting: Osteopathic Medicine

## 2019-09-11 VITALS — BP 134/83 | HR 84 | Ht 67.0 in | Wt 184.0 lb

## 2019-09-11 DIAGNOSIS — Z6828 Body mass index (BMI) 28.0-28.9, adult: Secondary | ICD-10-CM

## 2019-09-11 DIAGNOSIS — R1310 Dysphagia, unspecified: Secondary | ICD-10-CM | POA: Diagnosis not present

## 2019-09-11 DIAGNOSIS — Z87891 Personal history of nicotine dependence: Secondary | ICD-10-CM

## 2019-09-11 DIAGNOSIS — E559 Vitamin D deficiency, unspecified: Secondary | ICD-10-CM | POA: Diagnosis not present

## 2019-09-11 DIAGNOSIS — R7303 Prediabetes: Secondary | ICD-10-CM

## 2019-09-11 DIAGNOSIS — E663 Overweight: Secondary | ICD-10-CM

## 2019-09-11 LAB — POCT GLYCOSYLATED HEMOGLOBIN (HGB A1C): Hemoglobin A1C: 6 % — AB (ref 4.0–5.6)

## 2019-09-11 LAB — COMPLETE METABOLIC PANEL WITH GFR
AG Ratio: 1.4 (calc) (ref 1.0–2.5)
ALT: 20 U/L (ref 6–29)
AST: 17 U/L (ref 10–35)
Albumin: 4.4 g/dL (ref 3.6–5.1)
Alkaline phosphatase (APISO): 60 U/L (ref 37–153)
BUN: 14 mg/dL (ref 7–25)
CO2: 20 mmol/L (ref 20–32)
Calcium: 9.5 mg/dL (ref 8.6–10.4)
Chloride: 105 mmol/L (ref 98–110)
Creat: 0.72 mg/dL (ref 0.50–0.99)
GFR, Est African American: 103 mL/min/{1.73_m2} (ref >=60)
GFR, Est Non African American: 88 mL/min/{1.73_m2} (ref >=60)
Globulin: 3.1 g/dL (calc) (ref 1.9–3.7)
Glucose, Bld: 137 mg/dL (ref 65–139)
Potassium: 4.4 mmol/L (ref 3.5–5.3)
Sodium: 137 mmol/L (ref 135–146)
Total Bilirubin: 0.4 mg/dL (ref 0.2–1.2)
Total Protein: 7.5 g/dL (ref 6.1–8.1)

## 2019-09-11 LAB — IRON,TIBC AND FERRITIN PANEL
%SAT: 33 % (calc) (ref 16–45)
Ferritin: 119 ng/mL (ref 16–288)
Iron: 113 ug/dL (ref 45–160)
TIBC: 343 mcg/dL (calc) (ref 250–450)

## 2019-09-11 LAB — LIPID PANEL
Cholesterol: 210 mg/dL — ABNORMAL HIGH (ref <200)
HDL: 52 mg/dL (ref >=50)
LDL Cholesterol (Calc): 123 mg/dL (calc) — ABNORMAL HIGH
Non-HDL Cholesterol (Calc): 158 mg/dL (calc) — ABNORMAL HIGH (ref <130)
Total CHOL/HDL Ratio: 4 (calc) (ref <5.0)
Triglycerides: 228 mg/dL — ABNORMAL HIGH (ref <150)

## 2019-09-11 LAB — VITAMIN D 25 HYDROXY (VIT D DEFICIENCY, FRACTURES): Vit D, 25-Hydroxy: 51 ng/mL (ref 30–100)

## 2019-09-11 LAB — TSH: TSH: 1.37 mIU/L (ref 0.40–4.50)

## 2019-09-11 LAB — CBC
HCT: 44.7 % (ref 35.0–45.0)
Hemoglobin: 15.1 g/dL (ref 11.7–15.5)
MCH: 31.8 pg (ref 27.0–33.0)
MCHC: 33.8 g/dL (ref 32.0–36.0)
MCV: 94.1 fL (ref 80.0–100.0)
MPV: 10.5 fL (ref 7.5–12.5)
Platelets: 296 10*3/uL (ref 140–400)
RBC: 4.75 10*6/uL (ref 3.80–5.10)
RDW: 11.8 % (ref 11.0–15.0)
WBC: 8.1 10*3/uL (ref 3.8–10.8)

## 2019-09-11 LAB — POCT UA - MICROALBUMIN
Albumin/Creatinine Ratio, Urine, POC: <30
Creatinine, POC: 100 mg/dL
Microalbumin Ur, POC: 10 mg/L

## 2019-09-11 NOTE — Progress Notes (Signed)
To pcp

## 2019-09-11 NOTE — Progress Notes (Signed)
Mikayla French is a 64 y.o. female who presents to  [facility] at [facility]  today, 09/11/19, seeking care for the following:  . Discuss results lung cancer screening study - emphysema noted. . Due for labs - ordered as below  . Concern for breath feeling hot and feeling like choking sensation - no foul odor, no regurgitation, (+)GERD  . Unhappy with weight - requests dietician referral     ASSESSMENT & PLAN with other pertinent findings:  The primary encounter diagnosis was Prediabetes. Diagnoses of Dysphagia, unspecified type, Vitamin D deficiency, Former smoker, and Overweight with body mass index (BMI) of 28 to 28.9 in adult were also pertinent to this visit.   1. Prediabetes controlled  2. Dysphagia, unspecified type To GI - may need EGD, stricture?   3. Vitamin D deficiency Labs as below  4. Former smoker Reviewed CT results (+) emphysematous changes, pt reports no SOB problems  5. Overweight with body mass index (BMI) of 28 to 28.9 in adult Advised I am not medically concerned for severe obesity, though weight loss may help prevent progression of prediabetes to diabetes. Referral to nutritionist, pt was advised insurance may or may not cover this service    Results for orders placed or performed in visit on 09/11/19 (from the past 24 hour(s))  POCT UA - Microalbumin     Status: Normal   Collection Time: 09/11/19  8:10 AM  Result Value Ref Range   Microalbumin Ur, POC 10 mg/L   Creatinine, POC 100 mg/dL   Albumin/Creatinine Ratio, Urine, POC <30   POCT glycosylated hemoglobin (Hb A1C)     Status: Abnormal   Collection Time: 09/11/19  8:15 AM  Result Value Ref Range   Hemoglobin A1C 6.0 (A) 4.0 - 5.6 %   HbA1c POC (<> result, manual entry)     HbA1c, POC (prediabetic range)     HbA1c, POC (controlled diabetic range)     Body mass index is 28.82 kg/m.     Patient Instructions  Weight loss: important things to  remember  Things to remember for exercise for weight loss:   Please note - I am not a certified personal trainer. I can present you with ideas and general workout goals, but an exercise program is largely up to you. Find something you can stick with, and something you enjoy!   Slow progression will help prevent injury!  As you progress in your exercise regimen think about gradually increasing the following, week by week:   intensity (how strenuous is your workout?)  frequency (how often are you exercising?)  duration (how many minutes at a time are you exercising?)  Walking for 20 minutes a day is certainly better than nothing, but more strenuous exercise will develop better cardiovascular fitness.   interval training (high-intensity alternating with low-intensity, think walk/jog rather than just walk)  muscle strengthening exercises (weight lifting, calisthenics, yoga) - this also helps prevent osteoporosis!   Things to remember for diet changes for weight loss:   Please note - I am not a certified dietician. I can present you with ideas and general diet goals, but a meal plan is largely up to you. I am happy to refer you to a dietician who can give you a detailed meal plan.  Apps/logs can be helpful to track how you're eating! It's not realistic to be logging everything you eat forever, but when you're starting a healthy eating lifestyle it's very helpful, and checking  in with logs now and then helps you stick to your program!   Calorie restriction / portion control with the goal weight loss of no more than one to one and a half pounds per week.   Increase lean protein such as chicken, fish, [place].   Decrease fatty foods such as dairy, butter.   Decrease sugary foods and sweets. Avoid sugary drinks such as soda or juice.  Increase fiber found in fruit and vegetables, whole grains.     Orders Placed This Encounter  Procedures  . CBC  . COMPLETE METABOLIC PANEL WITH GFR   . Lipid panel  . TSH  . Fe+TIBC+Fer  . VITAMIN D 25 Hydroxy (Vit-D Deficiency, Fractures)  . Amb ref to Medical Nutrition Therapy-MNT  . Ambulatory referral to Gastroenterology  . POCT glycosylated hemoglobin (Hb A1C)  . POCT UA - Microalbumin    No orders of the defined types were placed in this encounter.      Follow-up instructions: Return for RECHECK PENDING RESULTS / IF WORSE OR CHANGE.                                         BP 134/83   Pulse 84   Ht 5\' 7"  (1.702 m)   Wt 184 lb (83.5 kg)   SpO2 96%   BMI 28.82 kg/m   Current Meds  Medication Sig  . ACIDOPHILUS LACTOBACILLUS PO Take by mouth.  b complex vitamins capsule Take by mouth.  . Cholecalciferol 1.25 MG (50000 UT) TABS Take by mouth.  . clobetasol cream (TEMOVATE) 0.05 % Use as directed bid  . conjugated estrogens (PREMARIN) vaginal cream USE ONE-HALF (1/2) GRAM VAGINALLY 2 TO 3 NIGHTS A WEEK  . Multiple Vitamin (MULTIVITAMIN) capsule Take 1 capsule by mouth every morning.  . Multiple Vitamins-Minerals (MULTI-VITAMIN MENOPAUSAL) TABS Take 2 tablets by mouth once.    . NP THYROID 30 MG tablet Take 30 mg by mouth daily.  . Nutritional Supplements (MELATONIN PO) Take by mouth as needed.    . phentermine (ADIPEX-P) 37.5 MG tablet Take by mouth.  . Pregnenolone POWD by Does not apply route.  Marland Kitchen PREMARIN vaginal cream Place vaginally.  Marland Kitchen thyroid (ARMOUR) 15 MG tablet Take one (1) 15 mg + one (1) 30 mg tablet for (45 mg Total) QOD  Alternating with (30 mg Total) QOD  . topiramate (TOPAMAX) 25 MG tablet Take by mouth.    Results for orders placed or performed in visit on 09/11/19 (from the past 72 hour(s))  POCT UA - Microalbumin     Status: Normal   Collection Time: 09/11/19  8:10 AM  Result Value Ref Range   Microalbumin Ur, POC 10 mg/L   Creatinine, POC 100 mg/dL   Albumin/Creatinine Ratio, Urine, POC <30   POCT glycosylated hemoglobin (Hb A1C)     Status: Abnormal    Collection Time: 09/11/19  8:15 AM  Result Value Ref Range   Hemoglobin A1C 6.0 (A) 4.0 - 5.6 %   HbA1c POC (<> result, manual entry)     HbA1c, POC (prediabetic range)     HbA1c, POC (controlled diabetic range)      No results found.     All questions at time of visit were answered - patient instructed to contact office with any additional concerns or updates.  ER/RTC precautions were reviewed with the patient as applicable.  Please note: voice recognition software was used to produce this document, and typos may escape review. Please contact Dr. Lyn Hollingshead for any needed clarifications.   Total encounter time: 30 minutes.

## 2019-09-11 NOTE — Patient Instructions (Signed)
Weight loss: important things to remember  Things to remember for exercise for weight loss:   Please note - I am not a certified personal trainer. I can present you with ideas and general workout goals, but an exercise program is largely up to you. Find something you can stick with, and something you enjoy!   Slow progression will help prevent injury!  As you progress in your exercise regimen think about gradually increasing the following, week by week:   intensity (how strenuous is your workout?)  frequency (how often are you exercising?)  duration (how many minutes at a time are you exercising?)  Walking for 20 minutes a day is certainly better than nothing, but more strenuous exercise will develop better cardiovascular fitness.   interval training (high-intensity alternating with low-intensity, think walk/jog rather than just walk)  muscle strengthening exercises (weight lifting, calisthenics, yoga) - this also helps prevent osteoporosis!   Things to remember for diet changes for weight loss:   Please note - I am not a certified dietician. I can present you with ideas and general diet goals, but a meal plan is largely up to you. I am happy to refer you to a dietician who can give you a detailed meal plan.  Apps/logs can be helpful to track how you're eating! It's not realistic to be logging everything you eat forever, but when you're starting a healthy eating lifestyle it's very helpful, and checking in with logs now and then helps you stick to your program!   Calorie restriction / portion control with the goal weight loss of no more than one to one and a half pounds per week.   Increase lean protein such as chicken, fish, [place].   Decrease fatty foods such as dairy, butter.   Decrease sugary foods and sweets. Avoid sugary drinks such as soda or juice.  Increase fiber found in fruit and vegetables, whole grains.

## 2019-09-16 ENCOUNTER — Encounter: Payer: Self-pay | Admitting: Osteopathic Medicine

## 2019-09-16 DIAGNOSIS — E785 Hyperlipidemia, unspecified: Secondary | ICD-10-CM

## 2019-09-17 NOTE — Telephone Encounter (Signed)
Pt is requesting a new rx for cholesterol. Per provider's recommendation.

## 2019-09-22 ENCOUNTER — Encounter: Payer: Self-pay | Admitting: Osteopathic Medicine

## 2019-09-22 MED ORDER — ATORVASTATIN CALCIUM 20 MG PO TABS: 20.0000 mg | ORAL_TABLET | Freq: Every day | ORAL | 3 refills | Status: AC

## 2019-10-15 ENCOUNTER — Ambulatory Visit: Admitting: Osteopathic Medicine

## 2019-10-30 ENCOUNTER — Emergency Department (INDEPENDENT_AMBULATORY_CARE_PROVIDER_SITE_OTHER)

## 2019-10-30 ENCOUNTER — Emergency Department (INDEPENDENT_AMBULATORY_CARE_PROVIDER_SITE_OTHER)
Admission: EM | Admit: 2019-10-30 | Discharge: 2019-10-30 | Disposition: A | Discharge status: Home / Self Care | Source: Home / Self Care

## 2019-10-30 ENCOUNTER — Encounter: Payer: Self-pay | Admitting: Emergency Medicine

## 2019-10-30 ENCOUNTER — Telehealth: Payer: Self-pay | Admitting: Emergency Medicine

## 2019-10-30 ENCOUNTER — Other Ambulatory Visit: Payer: Self-pay

## 2019-10-30 DIAGNOSIS — N3001 Acute cystitis with hematuria: Secondary | ICD-10-CM | POA: Diagnosis not present

## 2019-10-30 DIAGNOSIS — R109 Unspecified abdominal pain: Secondary | ICD-10-CM | POA: Diagnosis not present

## 2019-10-30 DIAGNOSIS — Z87891 Personal history of nicotine dependence: Secondary | ICD-10-CM

## 2019-10-30 DIAGNOSIS — R1084 Generalized abdominal pain: Secondary | ICD-10-CM

## 2019-10-30 DIAGNOSIS — R0902 Hypoxemia: Secondary | ICD-10-CM | POA: Diagnosis not present

## 2019-10-30 DIAGNOSIS — J189 Pneumonia, unspecified organism: Secondary | ICD-10-CM

## 2019-10-30 DIAGNOSIS — B370 Candidal stomatitis: Secondary | ICD-10-CM

## 2019-10-30 LAB — POCT CBC W AUTO DIFF (K'VILLE URGENT CARE)

## 2019-10-30 LAB — POCT URINALYSIS DIP (MANUAL ENTRY)
Glucose, UA: NEGATIVE mg/dL
Nitrite, UA: NEGATIVE
Protein Ur, POC: 100 mg/dL — AB
Spec Grav, UA: 1.02 (ref 1.010–1.025)
Urobilinogen, UA: 1 E.U./dL
pH, UA: 6 (ref 5.0–8.0)

## 2019-10-30 MED ORDER — FLUCONAZOLE 200 MG PO TABS: 200.0000 mg | ORAL_TABLET | Freq: Every day | ORAL | 0 refills | Status: DC

## 2019-10-30 MED ORDER — LEVOFLOXACIN 500 MG PO TABS: 500.0000 mg | ORAL_TABLET | Freq: Every day | ORAL | 0 refills | Status: DC

## 2019-10-30 NOTE — ED Provider Notes (Signed)
[facility]    CSN: [id number] Arrival date & time: 10/30/19  1306      History   Chief Complaint Chief Complaint  Patient presents with  . Back Pain    bilat   . Dysuria    HPI Mikayla French is a 64 y.o. female.   HPI Mikayla French is a 64 y.o. female presenting to UC with c/o bilateral flank pain and fatigue for about 1 week.  Mild relief with Tylenol.  She does report associated bladder pressure for 1 week. Denies fever, chills, n/v/d.  In triage she was noted to have an O2 Sat of 92-94%. Denies chest pain or SOB.  Had a negative home COVID test 3 days ago after "being around certain people."  She has not been vaccinated for COVID.  She does report red sore tongue that is similar to oral thrush but she has not been on any antibiotics recently, which has caused thrush in the past.  No recent use of inhalers.    Past Medical History:  Diagnosis Date  . Arthritis   . Back pain occasional   post decompression w/ chiropractor --  w/ good improvement  . Bunion of right foot   . GERD (gastroesophageal reflux disease) occ. reflux   watches diet/  takes pickle juice    Patient Active Problem List   Diagnosis Date Noted  . Prediabetes 08/29/2019  . Former smoker 08/29/2019  . Peripheral neuropathy 10/11/2018  . Elevated LFTs 05/24/2018  . Age-related nuclear cataract of both eyes 03/19/2018  . Dermatochalasis of both upper eyelids 03/19/2018  . Blepharitis of upper and lower eyelids of both eyes 03/19/2018  . Dry eye syndrome of both lacrimal glands 03/19/2018  . Hypertensive retinopathy of both eyes 03/19/2018  . Meibomian gland dysfunction (MGD) of both eyes 03/19/2018  . Allergic rhinitis 01/25/2018  . Bulging discs 01/25/2018  . Endometriosis 01/25/2018  . Acquired hypothyroidism 04/21/2017  . Complex tear of medial meniscus of right knee as current injury 02/07/2016  . Recurrent UTI 03/28/2011  . Hematuria of undiagnosed cause 01/31/2011    Past  Surgical History:  Procedure Laterality Date  . BUNIONECTOMY  10-04-2010   LEFT FOOT  . BUNIONECTOMY  01/18/2011   Procedure: Arbutus Leas;  Surgeon: Illene Labrador [place];  Location: [facility];  Service: Orthopedics;  Laterality: Right;  . EXCISION MORTON'S NEUROMA  10-04-2010   LEFT --  THIRD- [facility]  . REMOVAL BAKER'S CYST RIGHT KNEE  6 YRS AGO    OB History   No obstetric history on file.      Home Medications    Prior to Admission medications   Medication Sig Start Date End Date Taking? Authorizing Provider  estradiol (ESTRACE) 0.1 MG/GM vaginal cream 1/4 applicatorful per vagina at HS nightly for 2 weeks, then twice weekll]y as needed 01/13/08  Yes [provider]  ACIDOPHILUS LACTOBACILLUS PO Take by mouth.    [provider]  atorvastatin (LIPITOR) 20 MG tablet Take 1 tablet (20 mg total) by mouth daily. 09/22/19   Sunnie Nielsen, DO  b complex vitamins capsule Take by mouth.    [provider]  Cholecalciferol 1.25 MG (50000 UT) TABS Take by mouth.    [provider]  clobetasol cream (TEMOVATE) 0.05 % Use as directed bid 11/20/16   [provider]  conjugated estrogens (PREMARIN) vaginal cream USE ONE-HALF (1/2) GRAM VAGINALLY 2 TO 3 NIGHTS A WEEK 08/18/16   [provider]  levofloxacin (  LEVAQUIN) 500 MG tablet Take 1 tablet (500 mg total) by mouth daily. 10/30/19   Lurene Shadow, PA-C  Multiple Vitamin (MULTIVITAMIN) capsule Take 1 capsule by mouth every morning.    [provider]  Multiple Vitamins-Minerals (MULTI-VITAMIN MENOPAUSAL) TABS Take 2 tablets by mouth once.      [provider]  NP THYROID 30 MG tablet Take 30 mg by mouth daily. 08/06/19   [provider]  Nutritional Supplements (MELATONIN PO) Take by mouth as needed.      [provider]  phentermine (ADIPEX-P) 37.5 MG tablet Take by mouth. 08/06/19   [provider]  Pregnenolone POWD  by Does not apply route.    [provider]  PREMARIN vaginal cream Place vaginally. 04/01/19   [provider]  thyroid (ARMOUR) 15 MG tablet Take one (1) 15 mg + one (1) 30 mg tablet for (45 mg Total) QOD  Alternating with (30 mg Total) QOD 08/06/19   [provider]  topiramate (TOPAMAX) 25 MG tablet Take by mouth. 08/06/19   [provider]    Family History Family History  Problem Relation Age of Onset  . COPD Mother   . Heart failure Mother   . Diabetes Father   . Hypertension Father   . Heart failure Father     Social History Social History   Tobacco Use  . Smoking status: Former Smoker    Types: Cigarettes    Quit date: 01/11/2006    Years since quitting: 13.8  . Smokeless tobacco: Never Used  Vaping Use  . Vaping Use: Never used  Substance Use Topics  . Alcohol use: Yes    Comment: 1 WINE/ DAILY  . Drug use: No     Allergies   Latex, Nitrofurantoin macrocrystal, Other, Sulfamethoxazole, Adhesive [tape], Ibuprofen, Nitrofuran derivatives, and Penicillins   Review of Systems Review of Systems  Constitutional: Negative for chills and fever.  HENT: Positive for congestion (mild) and mouth sores (tongue). Negative for ear pain, sore throat, trouble swallowing and voice change.   Respiratory: Negative for cough and shortness of breath.   Cardiovascular: Negative for chest pain and palpitations.  Gastrointestinal: Positive for abdominal pain (bladder pressure). Negative for diarrhea, nausea and vomiting.  Genitourinary: Positive for dysuria and flank pain. Negative for decreased urine volume, frequency, hematuria, pelvic pain and urgency.  Musculoskeletal: Positive for back pain and myalgias. Negative for arthralgias.  Skin: Negative for rash.  All other systems reviewed and are negative.    Physical Exam Triage Vital Signs ED Triage Vitals  Enc Vitals Group     BP 10/30/19 1342 110/60     Pulse Rate 10/30/19 1342 94     Resp  10/30/19 1342 18     Temp 10/30/19 1342 98.9 F (37.2 C)     Temp Source 10/30/19 1331 Oral     SpO2 10/30/19 1342 93 %     Weight 10/30/19 1339 183 lb (83 kg)     Height 10/30/19 1339 5\' 5"  (1.651 m)     Head Circumference --      Peak Flow --      Pain Score 10/30/19 1340 5     Pain Loc --      Pain Edu? --      Excl. in GC? --    No data found.  Updated Vital Signs BP 110/60 (BP Location: Left Arm)   Pulse 84   Temp 98.9 F (37.2 C) (Oral)   Resp  18   Ht 5\' 5"  (1.651 m)   Wt 183 lb (83 kg)   SpO2 98%   BMI 30.45 kg/m   Visual Acuity Right Eye Distance:   Left Eye Distance:   Bilateral Distance:    Right Eye Near:   Left Eye Near:    Bilateral Near:     Physical Exam Vitals and nursing note reviewed.  Constitutional:      General: She is not in acute distress.    Appearance: Normal appearance. She is well-developed. She is not ill-appearing, toxic-appearing or diaphoretic.  HENT:     Head: Normocephalic and atraumatic.     Right Ear: Tympanic membrane and ear canal normal.     Left Ear: Tympanic membrane and ear canal normal.     Nose: Nose normal.     Mouth/Throat:     Lips: Pink.     Mouth: Mucous membranes are moist.     Tongue: Lesions ( erythema and white patches) present.     Pharynx: Oropharynx is clear. Uvula midline. Posterior oropharyngeal erythema present. No pharyngeal swelling, oropharyngeal exudate or uvula swelling.  Cardiovascular:     Rate and Rhythm: Normal rate and regular rhythm.  Pulmonary:     Effort: Pulmonary effort is normal. No respiratory distress.     Breath sounds: Normal breath sounds. No stridor. No wheezing, rhonchi or rales.  Musculoskeletal:        General: Normal range of motion.     Cervical back: Normal range of motion and neck supple. No tenderness.  Lymphadenopathy:     Cervical: No cervical adenopathy.  Skin:    General: Skin is warm and dry.  Neurological:     Mental Status: She is alert and oriented to  person, place, and time.  Psychiatric:        Behavior: Behavior normal.      UC Treatments / Results  Labs (all labs ordered are listed, but only abnormal results are displayed) Labs Reviewed  POCT URINALYSIS DIP (MANUAL ENTRY) - Abnormal; Notable for the following components:      Result Value   Clarity, UA cloudy (*)    Bilirubin, UA small (*)    Ketones, POC UA trace (5) (*)    Blood, UA trace-lysed (*)    Protein Ur, POC =100 (*)    Leukocytes, UA Small (1+) (*)    All other components within normal limits  URINE CULTURE  SARS-COV-2 RNA,(COVID-19) QUALITATIVE NAAT  COMPLETE METABOLIC PANEL WITH GFR  POCT CBC W AUTO DIFF ([facility])    EKG   Radiology DG Chest 2 View  Result Date: 10/30/2019 CLINICAL DATA:  Hypoxia EXAM: CHEST - 2 VIEW COMPARISON:  CT 09/03/2019 FINDINGS: Patchy pulmonary infiltrate has developed peripherally within the right mid lung zone and left lung base, possibly infectious or inflammatory in the appropriate clinical setting. The lungs are well expanded and are symmetric. No pneumothorax or pleural effusion. Cardiac size within normal limits. Pulmonary vascularity is normal. No acute bone abnormality. IMPRESSION: Multifocal peripheral pulmonary infiltrates, likely infectious or inflammatory in the acute setting. Electronically Signed   By: Helyn NumbersAshesh  Parikh MD   On: 10/30/2019 15:22    Procedures Procedures (including critical care time)  Medications Ordered in UC Medications - No data to display  Initial Impression / Assessment and Plan / UC Course  I have reviewed the triage vital signs and the nursing notes.  Pertinent labs & imaging results that were available during my care of  the patient were reviewed by me and considered in my medical decision making (see chart for details).     UA c/w UTI Culture sent Based on fatigue and low O2 Sat in triage, CXR ordered CXR concerning for multifocal pneumonia, will send COVID PCR test Will  start pt on Levaquin Oral exam concerning for oral thrush, will tx with diflucan Encouraged close f/u with PCP next week Discussed symptoms that warrant emergent care in the ED. AVS given  Final Clinical Impressions(s) / UC Diagnoses   Final diagnoses:  Flank pain  Generalized abdominal pain  Former smoker  Multifocal pneumonia  Acute cystitis with hematuria  Oral thrush     Discharge Instructions      You may take 500mg  acetaminophen every 4-6 hours or in combination with ibuprofen 400-600mg  every 6-8 hours as needed for pain, inflammation, and fever.  Be sure to well hydrated with clear liquids and get at least 8 hours of sleep at night, preferably more while sick.   Please follow up with family medicine in 1 week for recheck of symptoms. It is also recommended you purchase a home pulse-oxygen machine to help monitor your oxygen level at home. It should stay above 94% and your heart rate (pulse) should stay below 100 while at rest.    Call 911 or have someone drive you to the hospital if symptoms significantly worsening.   Due to concern for possibly having Covid-19, it is advised that you self-isolate at home until test results come back, usually 2-3 days.  If positive, it is recommended you stay isolated for at least 10 days after symptom onset and 24 hours after last fever without taking medication (whichever is longer).  If you MUST go out, please wear a mask at all times, limit contact with others.   If your test is negative, you still have plenty of time to get the Covid vaccine. It is recommended you schedule an appointment to get your vaccine once you get over this current illness.  Please ask your primary care provider about any questions/concerns related to the vaccine.    Call 911 or have someone drive you to the hospital if symptoms significantly worsening.  Please take your antibiotic as prescribed. A urine culture has been sent to check the severity of your  urinary infection and to determine if you are on the most appropriate antibiotic. The results should come back within 2-3 days. You will only be notified if a medication change is indicated.       ED Prescriptions    Medication Sig Dispense Auth. Provider   levofloxacin (LEVAQUIN) 500 MG tablet Take 1 tablet (500 mg total) by mouth daily. 7 tablet , PA-C     PDMP not reviewed this encounter.   Lurene Shadow, Lurene Shadow 10/30/19 1920

## 2019-10-30 NOTE — Telephone Encounter (Signed)
Diflucan sent to pharmacy for oral thrush. Encouraged f/u with PCP.

## 2019-10-30 NOTE — Discharge Instructions (Addendum)
  You may take 500mg  acetaminophen every 4-6 hours or in combination with ibuprofen 400-600mg  every 6-8 hours as needed for pain, inflammation, and fever.  Be sure to well hydrated with clear liquids and get at least 8 hours of sleep at night, preferably more while sick.   Please follow up with family medicine in 1 week for recheck of symptoms. It is also recommended you purchase a home pulse-oxygen machine to help monitor your oxygen level at home. It should stay above 94% and your heart rate (pulse) should stay below 100 while at rest.    Call 911 or have someone drive you to the hospital if symptoms significantly worsening.   Due to concern for possibly having Covid-19, it is advised that you self-isolate at home until test results come back, usually 2-3 days.  If positive, it is recommended you stay isolated for at least 10 days after symptom onset and 24 hours after last fever without taking medication (whichever is longer).  If you MUST go out, please wear a mask at all times, limit contact with others.   If your test is negative, you still have plenty of time to get the Covid vaccine. It is recommended you schedule an appointment to get your vaccine once you get over this current illness.  Please ask your primary care provider about any questions/concerns related to the vaccine.    Call 911 or have someone drive you to the hospital if symptoms significantly worsening.  Please take your antibiotic as prescribed. A urine culture has been sent to check the severity of your urinary infection and to determine if you are on the most appropriate antibiotic. The results should come back within 2-3 days. You will only be notified if a medication change is indicated.

## 2019-10-30 NOTE — ED Triage Notes (Addendum)
Lower back pain - bilateral - feels tender per pt x 1 week Min relief w/ Tylenol  Increased fatigue x 1week C/o bladder pressure x 1 week Denies fever or chills NO COVID Vaccine  O2 sat 92-94% in triage  Pt had a negative home COVID test 3 days ago Also concerned about bright red tongue -denies pain

## 2019-10-30 NOTE — Telephone Encounter (Signed)
Call to patient - no answer - message left regarding prescription for diflucan- unable to determine if earlier script had gone through- message left for patient to call back to confirm she had picked up med - okay to pick up in am

## 2019-10-31 ENCOUNTER — Telehealth: Payer: Self-pay | Admitting: Emergency Medicine

## 2019-10-31 LAB — COMPLETE METABOLIC PANEL WITH GFR
AG Ratio: 1.3 (calc) (ref 1.0–2.5)
ALT: 33 U/L — ABNORMAL HIGH (ref 6–29)
AST: 40 U/L — ABNORMAL HIGH (ref 10–35)
Albumin: 4.1 g/dL (ref 3.6–5.1)
Alkaline phosphatase (APISO): 53 U/L (ref 37–153)
BUN: 8 mg/dL (ref 7–25)
CO2: 23 mmol/L (ref 20–32)
Calcium: 8.8 mg/dL (ref 8.6–10.4)
Chloride: 99 mmol/L (ref 98–110)
Creat: 0.69 mg/dL (ref 0.50–0.99)
GFR, Est African American: 107 mL/min/{1.73_m2} (ref >=60)
GFR, Est Non African American: 92 mL/min/{1.73_m2} (ref >=60)
Globulin: 3.1 g/dL (calc) (ref 1.9–3.7)
Glucose, Bld: 149 mg/dL — ABNORMAL HIGH (ref 65–99)
Potassium: 3.7 mmol/L (ref 3.5–5.3)
Sodium: 135 mmol/L (ref 135–146)
Total Bilirubin: 0.5 mg/dL (ref 0.2–1.2)
Total Protein: 7.2 g/dL (ref 6.1–8.1)

## 2019-10-31 LAB — SARS-COV-2 RNA,(COVID-19) QUALITATIVE NAAT: SARS CoV2 RNA: DETECTED — CR

## 2019-10-31 LAB — URINE CULTURE
MICRO NUMBER:: 11045180
SPECIMEN QUALITY:: ADEQUATE

## 2019-10-31 NOTE — Telephone Encounter (Signed)
Call to patient to see how she was feeling this am & to let her know that diflucan had been called in to pharmacy on record. [facility] # left for call back questions. Patient also called last night

## 2019-11-07 ENCOUNTER — Other Ambulatory Visit: Payer: Self-pay

## 2019-11-12 ENCOUNTER — Ambulatory Visit: Admitting: Gastroenterology

## 2019-11-14 ENCOUNTER — Telehealth: Payer: Self-pay

## 2019-11-14 ENCOUNTER — Ambulatory Visit (HOSPITAL_BASED_OUTPATIENT_CLINIC_OR_DEPARTMENT_OTHER)
Admission: RE | Admit: 2019-11-14 | Discharge: 2019-11-14 | Disposition: A | Discharge status: Home / Self Care | Source: Ambulatory Visit | Attending: Emergency Medicine | Admitting: Emergency Medicine

## 2019-11-14 ENCOUNTER — Other Ambulatory Visit: Payer: Self-pay

## 2019-11-14 ENCOUNTER — Telehealth: Payer: Self-pay | Admitting: Emergency Medicine

## 2019-11-14 ENCOUNTER — Emergency Department (INDEPENDENT_AMBULATORY_CARE_PROVIDER_SITE_OTHER)
Admission: EM | Admit: 2019-11-14 | Discharge: 2019-11-14 | Disposition: A | Discharge status: Home / Self Care | Source: Home / Self Care

## 2019-11-14 DIAGNOSIS — I82441 Acute embolism and thrombosis of right tibial vein: Secondary | ICD-10-CM | POA: Insufficient documentation

## 2019-11-14 DIAGNOSIS — N390 Urinary tract infection, site not specified: Secondary | ICD-10-CM

## 2019-11-14 DIAGNOSIS — M25471 Effusion, right ankle: Secondary | ICD-10-CM

## 2019-11-14 DIAGNOSIS — R194 Change in bowel habit: Secondary | ICD-10-CM

## 2019-11-14 DIAGNOSIS — I82451 Acute embolism and thrombosis of right peroneal vein: Secondary | ICD-10-CM | POA: Diagnosis not present

## 2019-11-14 DIAGNOSIS — M79661 Pain in right lower leg: Secondary | ICD-10-CM

## 2019-11-14 DIAGNOSIS — B370 Candidal stomatitis: Secondary | ICD-10-CM | POA: Diagnosis not present

## 2019-11-14 LAB — POCT URINALYSIS DIP (MANUAL ENTRY)
Bilirubin, UA: NEGATIVE
Glucose, UA: NEGATIVE mg/dL
Ketones, POC UA: NEGATIVE mg/dL
Leukocytes, UA: NEGATIVE
Nitrite, UA: NEGATIVE
Protein Ur, POC: NEGATIVE mg/dL
Spec Grav, UA: 1.025 (ref 1.010–1.025)
Urobilinogen, UA: 0.2 E.U./dL
pH, UA: 6.5 (ref 5.0–8.0)

## 2019-11-14 MED ORDER — NYSTATIN 100000 UNIT/ML MT SUSP: 500000.0000 [IU] | Freq: Four times a day (QID) | OROMUCOSAL | 0 refills | Status: AC

## 2019-11-14 MED ORDER — APIXABAN (ELIQUIS) VTE STARTER PACK (10MG AND 5MG): ORAL_TABLET | ORAL | 0 refills | Status: AC

## 2019-11-14 MED ORDER — APIXABAN (ELIQUIS) VTE STARTER PACK (10MG AND 5MG): ORAL_TABLET | ORAL | 0 refills | Status: DC

## 2019-11-14 NOTE — Telephone Encounter (Signed)
Paper script printed and handed to pt in UC along with coupon.

## 2019-11-14 NOTE — ED Triage Notes (Signed)
Pt presents for dysuria, Left ankle swelling, and reports "yeast" in the mouth. All these symptoms started since she was discharged from Hospital (6 days) for COVID-19. Pt was not vaccinated. Pt has a follow up with a cardiologist for recently diagnosed A-fib and a Monday appt with an orthopedist.

## 2019-11-14 NOTE — Telephone Encounter (Signed)
Patient states the [facility] medications were called into does not have it in stock. Is requesting a paper script so she can take it somewhere that may have it. Provider aware.

## 2019-11-14 NOTE — Telephone Encounter (Signed)
Discussed pt with Dr. Leonides Grills and discussed ultrasound results with pt. Will have pt stop taking aspirin and start on eliquis. Encouraged close f/u with PCP next wee.

## 2019-11-14 NOTE — Discharge Instructions (Signed)
  An ultrasound of your Right lower leg has been ordered for [facility] today at [facility].   Please go there for an ultrasound to make sure you do not have a blood clot in your leg causing your symptoms.  Due to your recent COVID diagnosis, you are at an increased risk of blood clots.  If a clot goes to your heart or lungs, it can be deadly.    It is also highly recommended you establish care with a primary care provider as soon as possible for recheck of symptoms next week.   Call 911 or have someone drive you to the hospital if symptoms significantly worsening.

## 2019-11-14 NOTE — ED Provider Notes (Signed)
Mikayla French CARE    CSN: [id number] Arrival date & time: 11/14/19  1151      History   Chief Complaint Chief Complaint  Patient presents with  . Dysuria    HPI Mikayla French is a 64 y.o. female.   HPI Daci Stubbe is a 64 y.o. female presenting to UC with c/o pain in her mouth with redness of her tongue and white spots similar to when she had oral thrush last week.  Pt also c/o increased frequency of bowel movements but denies diarrhea. States she usually has a UTI when she has increased stool frequency. Denies pain with urination or urinary frequency. She also report Right ankle swelling and Right calf soreness since being discharged from the hospital on 11/07/19.  Pt was hospitalized for 6 days for COVID-19. She was started on 62m aspirin daily due to an episode of a-fib as well as known hypercoagulable state with recent COVID infection. Denies chest pain or SOB at this time.    Past Medical History:  Diagnosis Date  . Arthritis   . Back pain occasional   post decompression w/ chiropractor --  w/ good improvement  . Bunion of right foot   . GERD (gastroesophageal reflux disease) occ. reflux   watches diet/  takes pickle juice    Patient Active Problem List   Diagnosis Date Noted  . Prediabetes 08/29/2019  . Former smoker 08/29/2019  . Peripheral neuropathy 10/11/2018  . Elevated LFTs 05/24/2018  . Age-related nuclear cataract of both eyes 03/19/2018  . Dermatochalasis of both upper eyelids 03/19/2018  . Blepharitis of upper and lower eyelids of both eyes 03/19/2018  . Dry eye syndrome of both lacrimal glands 03/19/2018  . Hypertensive retinopathy of both eyes 03/19/2018  . Meibomian gland dysfunction (MGD) of both eyes 03/19/2018  . Allergic rhinitis 01/25/2018  . Bulging discs 01/25/2018  . Endometriosis 01/25/2018  . Acquired hypothyroidism 04/21/2017  . Complex tear of medial meniscus of right knee as current injury 02/07/2016  . Recurrent UTI  03/28/2011  . Hematuria of undiagnosed cause 01/31/2011    Past Surgical History:  Procedure Laterality Date  . BUNIONECTOMY  10-04-2010   LEFT FOOT  . BUNIONECTOMY  01/18/2011   Procedure: BLillard Anes  Surgeon: JLaurice RecordAplington;  Location: [facility]  Service: Orthopedics;  Laterality: Right;  . EXCISION MORTON'S NEUROMA  10-04-2010   LEFT --  THIRD- [facility]  . REMOVAL BAKER'S CYST RIGHT KNEE  6 YRS AGO    OB History   No obstetric history on file.      Home Medications    Prior to Admission medications   Medication Sig Start Date End Date Taking? Authorizing Provider  ACIDOPHILUS LACTOBACILLUS PO Take by mouth.    [provider]  APIXABAN ([facility] [place] VTE STARTER PACK (10MG AND 5MG) Take as directed on package: start with two-57mtablets twice daily for 7 days. On day 8, switch to one-31m90mablet twice daily. 11/14/19   PheNoe GensA-C  atorvastatin (LIPITOR) 20 MG tablet Take 1 tablet (20 mg total) by mouth daily. 09/22/19   AleEmeterio ReeveO  b complex vitamins capsule Take by mouth.    [provider]  Cholecalciferol 1.25 MG (50000 UT) TABS Take by mouth.    [provider]  clobetasol cream (TEMOVATE) 0.05 % Use as directed bid 11/20/16   [provider]  conjugated estrogens (PREMARIN) vaginal cream USE ONE-HALF (1/2) GRAM VAGINALLY 2 TO 3 NIGHTS A WEEK  08/18/16   [provider]  estradiol (ESTRACE) 0.1 MG/GM vaginal cream 1/4 applicatorful per vagina at HS nightly for 2 weeks, then twice weekll]y as needed 01/13/08   [provider]  fluconazole (DIFLUCAN) 200 MG tablet Take 1 tablet (200 mg total) by mouth daily. 10/30/19   Noe Gens, PA-C  levofloxacin (LEVAQUIN) 500 MG tablet Take 1 tablet (500 mg total) by mouth daily. 10/30/19   Noe Gens, PA-C  Multiple Vitamin (MULTIVITAMIN) capsule Take 1 capsule by mouth every morning.    [provider]  Multiple  Vitamins-Minerals (MULTI-VITAMIN MENOPAUSAL) TABS Take 2 tablets by mouth once.      [provider]  NP THYROID 30 MG tablet Take 30 mg by mouth daily. 08/06/19   [provider]  Nutritional Supplements (MELATONIN PO) Take by mouth as needed.      [provider]  nystatin (MYCOSTATIN) 100000 UNIT/ML suspension Take 5 mLs (500,000 Units total) by mouth 4 (four) times daily for 7 days. 11/14/19 11/21/19  Noe Gens, PA-C  phentermine (ADIPEX-P) 37.5 MG tablet Take by mouth. 08/06/19   [provider]  PREBIOTIC PRODUCT PO Take by mouth.    [provider]  Pregnenolone POWD by Does not apply route.    [provider]  PREMARIN vaginal cream Place vaginally. 04/01/19   [provider]  thyroid (ARMOUR) 15 MG tablet Take one (1) 15 mg + one (1) 30 mg tablet for (45 mg Total) QOD  Alternating with (30 mg Total) QOD 08/06/19   [provider]  topiramate (TOPAMAX) 25 MG tablet Take by mouth. 08/06/19   [provider]    Family History Family History  Problem Relation Age of Onset  . COPD Mother   . Heart failure Mother   . Diabetes Father   . Hypertension Father   . Heart failure Father     Social History Social History   Tobacco Use  . Smoking status: Former Smoker    Types: Cigarettes    Quit date: 01/11/2006    Years since quitting: 13.8  . Smokeless tobacco: Never Used  Vaping Use  . Vaping Use: Never used  Substance Use Topics  . Alcohol use: Yes    Comment: 1 WINE/ DAILY  . Drug use: No     Allergies   Latex, Nitrofurantoin macrocrystal, Other, Sulfamethoxazole, Tape, Ibuprofen, Nitrofuran derivatives, and Penicillins   Review of Systems Review of Systems  Constitutional: Negative for chills and fever.  Respiratory: Negative for shortness of breath.   Cardiovascular: Positive for leg swelling (Right). Negative for chest pain and palpitations.  Genitourinary: Negative for flank pain,  frequency, hematuria, pelvic pain and urgency.  Musculoskeletal: Positive for arthralgias, joint swelling and myalgias. Negative for back pain.     Physical Exam Triage Vital Signs ED Triage Vitals  Enc Vitals Group     BP 11/14/19 1208 113/79     Pulse Rate 11/14/19 1208 (!) 104     Resp 11/14/19 1208 20     Temp 11/14/19 1208 97.8 F (36.6 C)     Temp Source 11/14/19 1208 Oral     SpO2 11/14/19 1208 95 %     Weight 11/14/19 1205 171 lb (77.6 kg)     Height 11/14/19 1205 _0  (1.651 m)     Head Circumference --      Peak Flow --      Pain Score 11/14/19 1205 0     Pain Loc --  Pain Edu? --      Excl. in [place]? --    No data found.  Updated Vital Signs BP 113/79   Pulse (!) 104   Temp 97.8 F (36.6 C) (Oral)   Resp 20   Ht $R'5\' 5"'YP$  (1.651 m)   Wt 171 lb (77.6 kg)   SpO2 95%   BMI 28.46 kg/m   Visual Acuity Right Eye Distance:   Left Eye Distance:   Bilateral Distance:    Right Eye Near:   Left Eye Near:    Bilateral Near:     Physical Exam Vitals and nursing note reviewed.  Constitutional:      General: She is not in acute distress.    Appearance: Normal appearance. She is well-developed. She is not ill-appearing, toxic-appearing or diaphoretic.  HENT:     Head: Normocephalic and atraumatic.     Right Ear: Tympanic membrane and ear canal normal.     Left Ear: Tympanic membrane and ear canal normal.     Nose: Nose normal.     Mouth/Throat:     Lips: Pink.     Mouth: Mucous membranes are moist.     Tongue: Lesions (erythemaotus with white patches) present.     Pharynx: Oropharynx is clear. Uvula midline. No pharyngeal swelling, oropharyngeal exudate, posterior oropharyngeal erythema or uvula swelling.  Cardiovascular:     Rate and Rhythm: Normal rate and regular rhythm.     Pulses:          Dorsalis pedis pulses are 2+ on the right side.       Posterior tibial pulses are 2+ on the right side.  Pulmonary:     Effort: Pulmonary effort is normal. No  respiratory distress.     Breath sounds: Normal breath sounds.  Abdominal:     General: There is no distension.     Palpations: Abdomen is soft.     Tenderness: There is no abdominal tenderness. There is no right CVA tenderness.  Musculoskeletal:        General: Swelling and tenderness present. Normal range of motion.     Cervical back: Normal range of motion.     Comments: Right ankle: mild edema to medial aspect. Mild diffuse calf tenderness. Mild swelling of Right lower leg compared to left.  Full ROM knee and ankle. No bony tenderness.  Skin:    General: Skin is warm and dry.     Findings: No bruising or erythema.  Neurological:     Mental Status: She is alert and oriented to person, place, and time.  Psychiatric:        Behavior: Behavior normal.      UC Treatments / Results  Labs (all labs ordered are listed, but only abnormal results are displayed) Labs Reviewed  POCT URINALYSIS DIP (MANUAL ENTRY) - Abnormal; Notable for the following components:      Result Value   Clarity, UA cloudy (*)    Blood, UA trace-intact (*)    All other components within normal limits    EKG   Radiology US Venous Img Lower Unilateral Right (DVT)  Result Date: 11/14/2019 CLINICAL DATA:  Right calf pain and swelling. EXAM: Right LOWER EXTREMITY VENOUS DOPPLER ULTRASOUND TECHNIQUE: Gray-scale sonography with graded compression, as well as color Doppler and duplex ultrasound were performed to evaluate the lower extremity deep venous systems from the level of the common femoral vein and including the common femoral, femoral, profunda femoral, popliteal and calf veins including the posterior tibial,  peroneal and gastrocnemius veins when visible. The superficial great saphenous vein was also interrogated. Spectral Doppler was utilized to evaluate flow at rest and with distal augmentation maneuvers in the common femoral, femoral and popliteal veins. COMPARISON:  None. FINDINGS: Contralateral Common  Femoral Vein: Respiratory phasicity is normal and symmetric with the symptomatic side. No evidence of thrombus. Normal compressibility. Common Femoral Vein: No evidence of thrombus. Normal compressibility, respiratory phasicity and response to augmentation. Saphenofemoral Junction: No evidence of thrombus. Normal compressibility and flow on color Doppler imaging. Profunda Femoral Vein: No evidence of thrombus. Normal compressibility and flow on color Doppler imaging. Femoral Vein: No evidence of thrombus. Normal compressibility, respiratory phasicity and response to augmentation. Popliteal Vein: No evidence of thrombus. Normal compressibility, respiratory phasicity and response to augmentation. Calf Veins: Posterior tibial and peroneal veins are noncompressible consistent with deep venous thrombosis. Superficial Great Saphenous Vein: No evidence of thrombus. Normal compressibility. Venous Reflux:  None. Other Findings:  None. IMPRESSION: Acute deep venous thrombosis is seen involving the right posterior tibial and peroneal veins. These results will be called to the ordering clinician or representative by the Radiologist Assistant, and communication documented in the PACS or zVision Dashboard. Electronically Signed   By: Marijo Conception M.D.   On: 11/14/2019 15:47    Procedures Procedures (including critical care time)  Medications Ordered in UC Medications - No data to display  Initial Impression / Assessment and Plan / UC Course  I have reviewed the triage vital signs and the nursing notes.  Pertinent labs & imaging results that were available during my care of the patient were reviewed by me and considered in my medical decision making (see chart for details).     Hx and oral exam c/w thrush. Due to elevated LFTs during recent hospitalization, will tx with nystatin instead of diflucan or clotrimazole troches  Pt sent to [facility] for venous US Discussed [place] results with pt Consulted with Dr. Lanny Cramp,  will start pt on starter kit of Eliquis, spot baby aspirin.  Encouraged close f/u with PCP next week Discussed symptoms that warrant emergent care in the ED. AVS given    Final Clinical Impressions(s) / UC Diagnoses   Final diagnoses:  Recurrent UTI  Frequent bowel movements  Ankle swelling, right  Oral thrush  Right calf pain     Discharge Instructions      An ultrasound of your Right lower leg has been ordered for [facility] today at [facility].   Please go there for an ultrasound to make sure you do not have a blood clot in your leg causing your symptoms.  Due to your recent COVID diagnosis, you are at an increased risk of blood clots.  If a clot goes to your heart or lungs, it can be deadly.    It is also highly recommended you establish care with a primary care provider as soon as possible for recheck of symptoms next week.   Call 911 or have someone drive you to the hospital if symptoms significantly worsening.     ED Prescriptions    Medication Sig Dispense Auth. Provider   nystatin (MYCOSTATIN) 100000 UNIT/ML suspension Take 5 mLs (500,000 Units total) by mouth 4 (four) times daily for 7 days. 60 mL Noe Gens, PA-C     PDMP not reviewed this encounter.   Noe Gens, [place] 11/14/19 1705

## 2019-11-18 ENCOUNTER — Telehealth: Payer: Self-pay | Admitting: *Deleted

## 2019-11-18 ENCOUNTER — Ambulatory Visit (INDEPENDENT_AMBULATORY_CARE_PROVIDER_SITE_OTHER): Admitting: Internal Medicine

## 2019-11-18 ENCOUNTER — Encounter: Admitting: Dietician

## 2019-11-18 ENCOUNTER — Other Ambulatory Visit: Payer: Self-pay

## 2019-11-18 ENCOUNTER — Telehealth: Payer: Self-pay | Admitting: Dietician

## 2019-11-18 DIAGNOSIS — B370 Candidal stomatitis: Secondary | ICD-10-CM | POA: Diagnosis not present

## 2019-11-18 DIAGNOSIS — K1379 Other lesions of oral mucosa: Secondary | ICD-10-CM | POA: Diagnosis not present

## 2019-11-18 MED ORDER — MAGIC MOUTHWASH W/LIDOCAINE: 5.0000 mL | Freq: Four times a day (QID) | ORAL | 0 refills | Status: AC | PRN

## 2019-11-18 NOTE — Progress Notes (Signed)
I connected with  Mikayla French on 11/19/19 by a video enabled telemedicine application and verified that I am speaking with the correct person using two identifiers.   I discussed the limitations of evaluation and management by telemedicine. The patient expressed understanding and agreed to proceed.    CC: Mouth pain  This is a telephone encounter between Mikayla French and Mikayla French on 11/19/2019 for mouth pain. The visit was conducted with the patient located at home and Mikayla French at [facility]. The patient's identity was confirmed using their DOB and current address. The patient has consented to being evaluated through a telephone encounter and understands the associated risks (an examination cannot be done and the patient may need to come in for an appointment) / benefits (allows the patient to remain at home, decreasing exposure to coronavirus). I personally spent 11 minutes on medical discussion.   HPI:  Mikayla French is a 64 y.o. with PMH as below.   Please see A&P for assessment of the patient's acute and chronic medical conditions.   Past Medical History:  Diagnosis Date  . Arthritis   . Back pain occasional   post decompression w/ chiropractor --  w/ good improvement  . Bunion of right foot   . GERD (gastroesophageal reflux disease) occ. reflux   watches diet/  takes pickle juice   Review of Systems:  Negative except what is noted in assessment and plan.    Assessment & Plan:   See Encounters Tab for problem based charting.  Patient discussed with Dr. Mayford Knife

## 2019-11-18 NOTE — Telephone Encounter (Signed)
Call from pt - stated she has a hx of blood clot, on a blood thinner. She's a new pt, has not been seen yet; per Epic appt on Thursday 10/28. She's c/o mouth being raw; trouble eating and concern since she's on a blood thinner. Per chart, she was tested + for Covid 10/7. Telehealth appt explained to pt who argreed.  Telehealth appt today @ 1545 PM with Dr Chesley Mires.

## 2019-11-18 NOTE — Telephone Encounter (Signed)
Patient presented today for appointment with dietician. Patient reports testing positive for covid herself within the last month, has not been tested for covid in the last 14 days or had a negative test after positive result. It looks as though patient has an admission 11/01/2019 and was discharged 11/07/2019.   Patient informed appointment would need to be rescheduled. Patient does not wish to reschedule at this time. Per patient she will see her dietician in [place].

## 2019-11-19 ENCOUNTER — Encounter: Admitting: Student

## 2019-11-19 ENCOUNTER — Encounter: Payer: Self-pay | Admitting: Internal Medicine

## 2019-11-19 DIAGNOSIS — B370 Candidal stomatitis: Secondary | ICD-10-CM | POA: Insufficient documentation

## 2019-11-19 NOTE — Assessment & Plan Note (Addendum)
Patient presents for a telephone visit to follow up her recent urgent care visit in which she was diagnosed with oral candidiasis and treated with nystatin. She states that she finished the treatment and the white plaques have gone away, but her mouth remains erythematous, dry and uncomfortable. She is able to tolerate oral hydration, but had a difficult time with food. She denies any oral lesions concerning for stomatitis and she is not on any high risk medications for xerostomia. She denies recent fever, chills, night seats,dysohagia or odynophagia. She denies sick contacts or new medications.   I counseled her on trying to stay hydrated and supplement nutrition with lactose free protein drinks like ensure. I will prescribe her some Lidocaine mouth wash to help with the pain and follow up with her on Thursdayto re-evaluate her symptoms in person.

## 2019-11-20 ENCOUNTER — Other Ambulatory Visit: Payer: Self-pay

## 2019-11-20 ENCOUNTER — Encounter: Payer: Self-pay | Admitting: Student

## 2019-11-20 ENCOUNTER — Encounter: Admitting: Student

## 2019-11-20 ENCOUNTER — Ambulatory Visit (INDEPENDENT_AMBULATORY_CARE_PROVIDER_SITE_OTHER): Admitting: Student

## 2019-11-20 VITALS — BP 138/80 | HR 90 | Temp 98.1°F | Ht 65.0 in | Wt 177.2 lb

## 2019-11-20 DIAGNOSIS — I480 Paroxysmal atrial fibrillation: Secondary | ICD-10-CM | POA: Diagnosis not present

## 2019-11-20 DIAGNOSIS — Z1231 Encounter for screening mammogram for malignant neoplasm of breast: Secondary | ICD-10-CM | POA: Diagnosis not present

## 2019-11-20 DIAGNOSIS — B370 Candidal stomatitis: Secondary | ICD-10-CM | POA: Diagnosis not present

## 2019-11-20 DIAGNOSIS — I82451 Acute embolism and thrombosis of right peroneal vein: Secondary | ICD-10-CM

## 2019-11-20 MED ORDER — FLUCONAZOLE 100 MG PO TABS: 200.0000 mg | ORAL_TABLET | Freq: Every day | ORAL | 0 refills | Status: AC

## 2019-11-20 MED ORDER — MAGIC MOUTHWASH W/LIDOCAINE: 5.0000 mL | Freq: Four times a day (QID) | ORAL | 3 refills | Status: DC | PRN

## 2019-11-20 NOTE — Patient Instructions (Addendum)
Mikayla French,   Today, we discussed that you will not need repeat ultrasound of your leg to assess your clot. You seem to be responding very well to Eliquis 5mg  in the morning and 5mg  at night (2 pills daily), so we will continue that regimen for 3 months.  You should stop taking this medication February 14, 2020.   Please make a follow up appointment with your cardiologist as soon as you are able for follow up of your Afib and inform him of you new medications.  I have prescribed magic mouthwash to use as needed (with refills) and Diflucan, 2 pills daily for 3 days total. Please call if your symptoms do not improve over the next week.   Thank you and it was a pleasure meeting you,  Dr.   Deep Vein Thrombosis  Deep vein thrombosis (DVT) is a condition in which a blood clot forms in a deep vein, such as a lower leg, thigh, or arm vein. A clot is blood that has thickened into a gel or solid. This condition is dangerous. It can lead to serious and even life-threatening complications if the clot travels to the lungs and causes a blockage (pulmonary embolism). It can also damage veins in the leg. This can result in leg pain, swelling, discoloration, and sores (post-thrombotic syndrome). What are the causes? This condition may be caused by:  A slowdown of blood flow.  Damage to a vein.  A condition that causes blood to clot more easily, such as an inherited clotting disorder. What increases the risk? The following factors may make you more likely to develop this condition:  Being overweight.  Being older, especially over age 25.  Sitting or lying down for more than four hours.  Being in the hospital.  Lack of physical activity (sedentary lifestyle).  Pregnancy, being in childbirth, or having recently given birth.  Taking medicines that contain estrogen, such as medicines to prevent pregnancy.  Smoking.  A history of any of the following: ? Blood clots or a blood clotting  disease. ? Peripheral vascular disease. ? Inflammatory bowel disease. ? Cancer. ? Heart disease. ? Genetic conditions that affect how your blood clots, such as Factor V Leiden mutation. ? Neurological diseases that affect your legs (leg paresis). ? A recent injury, such as a car accident. ? Major or lengthy surgery. ? A central line placed inside a large vein. What are the signs or symptoms? Symptoms of this condition include:  Swelling, pain, or tenderness in an arm or leg.  Warmth, redness, or discoloration in an arm or leg. If the clot is in your leg, symptoms may be more noticeable or worse when you stand or walk. Some people may not develop any symptoms. How is this diagnosed? This condition is diagnosed with:  A medical history and physical exam.  Tests, such as: ? Blood tests. These are done to check how well your blood clots. ? Ultrasound. This is done to check for clots. ? Venogram. For this test, contrast dye is injected into a vein and X-rays are taken to check for any clots. How is this treated? Treatment for this condition depends on:  The cause of your DVT.  Your risk for bleeding or developing more clots.  Any other medical conditions that you have. Treatment may include:  Taking a blood thinner (anticoagulant). This type of medicine prevents clots from forming. It may be taken by mouth, injected under the skin, or injected through an IV (catheter).  Injecting clot-dissolving medicines into the affected vein (catheter-directed thrombolysis).  Having surgery. Surgery may be done to: ? Remove the clot. ? Place a filter in a large vein to catch blood clots before they reach the lungs. Some treatments may be continued for up to six months. Follow these instructions at home: If you are taking blood thinners:  Take the medicine exactly as told by your health care provider. Some blood thinners need to be taken at the same time every day. Do not skip a  dose.  Talk with your health care provider before you take any medicines that contain aspirin or NSAIDs. These medicines increase your risk for dangerous bleeding.  Ask your health care provider about foods and drugs that could change the way the medicine works (may interact). Avoid those things if your health care provider tells you to do so.  Blood thinners can cause easy bruising and may make it difficult to stop bleeding. Because of this: ? Be very careful when using knives, scissors, or other sharp objects. ? Use an electric razor instead of a blade. ? Avoid activities that could cause injury or bruising, and follow instructions about how to prevent falls.  Wear a medical alert bracelet or carry a card that lists what medicines you take. General instructions  Take over-the-counter and prescription medicines only as told by your health care provider.  Return to your normal activities as told by your health care provider. Ask your health care provider what activities are safe for you.  Wear compression stockings if recommended by your health care provider.  Keep all follow-up visits as told by your health care provider. This is important. How is this prevented? To lower your risk of developing this condition again:  For 30 or more minutes every day, do an activity that: ? Involves moving your arms and legs. ? Increases your heart rate.  When traveling for longer than four hours: ? Exercise your arms and legs every hour. ? Drink plenty of water. ? Avoid drinking alcohol.  Avoid sitting or lying for a long time without moving your legs.  If you have surgery or you are hospitalized, ask about ways to prevent blood clots. These may include taking frequent walks or using anticoagulants.  Stay at a healthy weight.  If you are a woman who is older than age 53, avoid unnecessary use of medicines that contain estrogen, such as some birth control pills.  Do not use any products that  contain nicotine or tobacco, such as cigarettes and e-cigarettes. This is especially important if you take estrogen medicines. If you need help quitting, ask your health care provider. Contact a health care provider if:  You miss a dose of your blood thinner.  Your menstrual period is heavier than usual.  You have unusual bruising. Get help right away if:  You have: ? New or increased pain, swelling, or redness in an arm or leg. ? Numbness or tingling in an arm or leg. ? Shortness of breath. ? Chest pain. ? A rapid or irregular heartbeat. ? A severe headache or confusion. ? A cut that will not stop bleeding.  There is blood in your vomit, stool, or urine.  You have a serious fall or accident, or you hit your head.  You feel light-headed or dizzy.  You cough up blood. These symptoms may represent a serious problem that is an emergency. Do not wait to see if the symptoms will go away. Get medical help right away.  Call your local emergency services (911 in the [place] Do not drive yourself to the hospital. Summary  Deep vein thrombosis (DVT) is a condition in which a blood clot forms in a deep vein, such as a lower leg, thigh, or arm vein.  Symptoms can include swelling, warmth, pain, and redness in your leg or arm.  This condition may be treated with a blood thinner (anticoagulant medicine), medicine that is injected to dissolve blood clots,compression stockings, or surgery.  If you are prescribed blood thinners, take them exactly as told. This information is not intended to replace advice given to you by your health care provider. Make sure you discuss any questions you have with your health care provider. Document Revised: 12/22/2016 Document Reviewed: 06/09/2016 Elsevier Patient Education  2020 [employer].

## 2019-11-21 DIAGNOSIS — I4891 Unspecified atrial fibrillation: Secondary | ICD-10-CM | POA: Insufficient documentation

## 2019-11-21 DIAGNOSIS — I82409 Acute embolism and thrombosis of unspecified deep veins of unspecified lower extremity: Secondary | ICD-10-CM | POA: Insufficient documentation

## 2019-11-21 NOTE — Assessment & Plan Note (Signed)
Patient was noted to have a single episode of non-sustained Atrial fibrillation during recent hospitalization for COVID-19 infection. She was started on metoprolol tartrate 50mg  twice daily. On exam today, rate and rhythm are normal. She denies any CP, palpitations, shortness of breath, or any history of Afib. - She already follows with a cardiologist - Encouraged patient to call to inform her cardiologist to be seen for appointment - Continue metoprolol tartrate 50mg  twice daily - Continue Eliquis 5mg  twice daily for at least 3 months; perhaps longer if Afib returns for anticoagulation.

## 2019-11-21 NOTE — Assessment & Plan Note (Signed)
Patient had telehealth visit yesterday for follow up of oral thrush s/p steroids for COVID-19 infection. She experienced some relief and lesions resolved with nystatin swishes; however, overnight she developed a tiny lesion on the tip of her tongue with severe pain. No throat pain or difficulty swallowing.  - Will prescribe 3 days of Diflucan 200mg   - Prescribed Magic Mouthwash for pain - Continue chloraseptic spray as needed - Instructed patient to call back if symptoms do not improve s/p Diflucan.

## 2019-11-21 NOTE — Assessment & Plan Note (Signed)
Patient was diagnosed with acute RLE posterior tibial and peroneal veinous thromboses 11/14/19 following COVID-19 infection, without other provoking risk factors or history of DVT or PE. She was prescribed a loading dose of Eliquis 10mg  twice daily for 7 days followed by 5mg  twice daily for 3 months; however, she has only been taking 5mg  twice daily. Her symptoms of pain and swelling have almost completely resolved. She denies any new or worsening SOB, cough, pleuritic CP, palpitations, fevers or chills. She denies any bleeding aside from nosebleed day 1 which resolved quickly.  - given great response, continue Eliquis 5mg  twice daily for 3 months (stop date of 02/14/19). - Patient wishes to get primary care closer to home in [place]; encouraged her to reach out to offices and call if she is unable to find provider near her.

## 2019-11-21 NOTE — Progress Notes (Signed)
   CC: DVT  HPI:  [name] is a 64 y.o. female, new to our clinic after recent admission for COVID-19 hospitalization 10/9-10/15, found to have a single episode of A-fib during hospitalization without known history and subsequent oral thrush and acute, provoked RLE DVT 11/14/19, presenting for follow up of DVT and thrush. She states that starting 11/14/19, she experienced sudden, severe swelling and pain of her right calf and foot. Doppler ultrasound showed acute DVT, and she was prescribed an Eliquis loading dose. She notes she was unaware to take two tablets twice daily, and had been taking 5mg  in the morning and 5mg  at night, consistently without missing doses. She says her pain in her right leg has resolved and swelling has significantly improved. She does note more mild swelling in her left leg as well, but states ultrasound was not performed on the left. She does smoke cigarettes but denies any history of prolonged immobility, recent surgeries or trauma, hormonal therapy, or any history of blood clots or clotting disorder. She denies any new or worsening shortness of breath, palpitations, fevers, chills, dyspnea or cough. She says her thrush significantly improved with Nystatin swishes, although returned overnight. She endorses severe pain "like a thousand needles" with very small white lesions on the tip of her tongue. She denies any pain in her throat or trouble swallowing, immunocompromised state, or history of thrush. She denies any nausea, vomiting, or any other symptoms other than mild residual shortness of breath and impaired memory which she attributes to recent COVID infection. She is not interested in vaccinations today.   Past Medical History:  Diagnosis Date  . Arthritis   . Back pain occasional   post decompression w/ chiropractor --  w/ good improvement  . Bunion of right foot   . GERD (gastroesophageal reflux disease) occ. reflux   watches diet/  takes pickle juice    Family History  Problem Relation Age of Onset  . COPD Mother   . Heart failure Mother   . Diabetes Father   . Hypertension Father   . Heart failure Father   Patient does endorse a history of cancer in her family, although denies any personal history of cancer.   Review of Systems:  Positive for submandibular lymphadenopathy. All others negative except as noted above in HPI.   Physical Exam:  Vitals:   11/20/19 1559  BP: 138/80  Pulse: 90  Temp: 98.1 F (36.7 C)  TempSrc: Oral  SpO2: 97%  Weight: 177 lb 3.2 oz (80.4 kg)  Height: 5\' 5"  (1.651 m)   General: Patient appears well. No acute distress. Eyes: Sclera non-icteric. No conjunctival injection.  HENT: There is a single, tiny white lesion consistent with thrush at the tip of patient's tongue. No erythema or bleeding. No posterior pharyngeal erythema. Moist mucus membranes.  Respiratory: Lungs are CTA, bilaterally. No wheezes, rales, or rhonchi.  Cardiovascular: Regular rate and rhythm. No murmurs, rubs, or gallops. There is prominence of the veins in the right lower extremity but no significant swelling of the bilateral lower extremities. Extremities are warm and well-perfused with 2+ distal pulses, bilaterally. Abdominal: Soft and non-tender to palpation. Bowel sounds intact. No rebound or guarding. Skin: No discoloration of bilateral lower extremities. Warm and dry. No lesions or rashes. Psych: Normal affect. Normal tone of voice.   Assessment & Plan:   See Encounters Tab for problem based charting.  Patient seen with Dr. .   11/22/19, MD 11/21/2019, 8:55 AM Pager: [phone]

## 2019-11-24 NOTE — Progress Notes (Signed)
Internal Medicine Clinic Attending  I saw and evaluated the patient.  I personally confirmed the key portions of the history and exam documented by Dr. Speakman and I reviewed pertinent patient test results.  The assessment, diagnosis, and plan were formulated together and I agree with the documentation in the resident's note.  

## 2019-11-25 ENCOUNTER — Other Ambulatory Visit: Payer: Self-pay

## 2019-11-25 ENCOUNTER — Emergency Department (INDEPENDENT_AMBULATORY_CARE_PROVIDER_SITE_OTHER)
Admission: EM | Admit: 2019-11-25 | Discharge: 2019-11-25 | Disposition: A | Discharge status: Home / Self Care | Payer: Medicare Other | Source: Home / Self Care | Attending: Family Medicine | Admitting: Family Medicine

## 2019-11-25 DIAGNOSIS — N390 Urinary tract infection, site not specified: Secondary | ICD-10-CM | POA: Diagnosis not present

## 2019-11-25 DIAGNOSIS — N3090 Cystitis, unspecified without hematuria: Secondary | ICD-10-CM

## 2019-11-25 LAB — POCT URINALYSIS DIP (MANUAL ENTRY)
Bilirubin, UA: NEGATIVE
Glucose, UA: NEGATIVE mg/dL
Ketones, POC UA: NEGATIVE mg/dL
Nitrite, UA: NEGATIVE
Spec Grav, UA: 1.025 (ref 1.010–1.025)
Urobilinogen, UA: 0.2 E.U./dL
pH, UA: 5 (ref 5.0–8.0)

## 2019-11-25 MED ORDER — CIPROFLOXACIN HCL 500 MG PO TABS: ORAL_TABLET | ORAL | 0 refills | Status: DC

## 2019-11-25 NOTE — Discharge Instructions (Addendum)
Increase fluid intake. May use non-prescription AZO for about two days, if desired, to decrease urinary discomfort.  If symptoms become significantly worse during the night or over the weekend, proceed to the local emergency room.  

## 2019-11-25 NOTE — ED Provider Notes (Signed)
[facility]    CSN: [id number] Arrival date & time: 11/25/19  1106      History   Chief Complaint Chief Complaint  Patient presents with  . Dysuria    HPI Mikayla French is a 64 y.o. female.   Patient complains of a vague lower back ache for one week with onset of dysuria and nocturia.  The history is provided by the patient.  Dysuria Pain quality:  Burning Pain severity:  Mild Onset quality:  Gradual Duration:  1 week Timing:  Constant Progression:  Unchanged Chronicity:  Recurrent Recent urinary tract infections: yes   Relieved by:  Nothing Worsened by:  Nothing Ineffective treatments:  None tried Urinary symptoms: frequent urination and hesitancy   Urinary symptoms: no discolored urine, no foul-smelling urine, no hematuria and no bladder incontinence   Associated symptoms: no abdominal pain, no fever, no flank pain and no nausea   Risk factors: recurrent urinary tract infections     Past Medical History:  Diagnosis Date  . Arthritis   . Back pain occasional   post decompression w/ chiropractor --  w/ good improvement  . Bunion of right foot   . GERD (gastroesophageal reflux disease) occ. reflux   watches diet/  takes pickle juice    Patient Active Problem List   Diagnosis Date Noted  . Acute deep vein thrombosis (DVT) (HCC) 11/21/2019  . A-fib (HCC) 11/21/2019  . Oral candidiasis 11/19/2019  . Prediabetes 08/29/2019  . Former smoker 08/29/2019  . Peripheral neuropathy 10/11/2018  . Age-related nuclear cataract of both eyes 03/19/2018  . Dermatochalasis of both upper eyelids 03/19/2018  . Blepharitis of upper and lower eyelids of both eyes 03/19/2018  . Dry eye syndrome of both lacrimal glands 03/19/2018  . Hypertensive retinopathy of both eyes 03/19/2018  . Meibomian gland dysfunction (MGD) of both eyes 03/19/2018  . Allergic rhinitis 01/25/2018  . Bulging discs 01/25/2018  . Endometriosis 01/25/2018  . Acquired hypothyroidism  04/21/2017  . Complex tear of medial meniscus of right knee as current injury 02/07/2016  . Recurrent UTI 03/28/2011  . Hematuria of undiagnosed cause 01/31/2011    Past Surgical History:  Procedure Laterality Date  . BUNIONECTOMY  10-04-2010   LEFT FOOT  . BUNIONECTOMY  01/18/2011   Procedure: Arbutus Leas;  Surgeon: Illene Labrador [place];  Location: [facility];  Service: Orthopedics;  Laterality: Right;  . EXCISION MORTON'S NEUROMA  10-04-2010   LEFT --  THIRD- [facility]  . REMOVAL BAKER'S CYST RIGHT KNEE  6 YRS AGO    OB History   No obstetric history on file.      Home Medications    Prior to Admission medications   Medication Sig Start Date End Date Taking? Authorizing Provider  ACIDOPHILUS LACTOBACILLUS PO Take by mouth.    [provider]  APIXABAN Everlene Balls) VTE STARTER PACK (10MG  AND 5MG ) Take as directed on package: start with two-5mg  tablets twice daily for 7 days. On day 8, switch to one-5mg  tablet twice daily. 11/14/19   , PA-C  atorvastatin (LIPITOR) 20 MG tablet Take 1 tablet (20 mg total) by mouth daily. 09/22/19   Lurene Shadow, DO  b complex vitamins capsule Take by mouth.    [provider]  Cholecalciferol 1.25 MG (50000 UT) TABS Take by mouth.    [provider]  ciprofloxacin (CIPRO) 500 MG tablet Take one tab PO Q12hr 11/25/19   Sunnie Nielsen, MD  clobetasol cream (TEMOVATE) 0.05 %  Use as directed bid 11/20/16   [provider]  conjugated estrogens (PREMARIN) vaginal cream USE ONE-HALF (1/2) GRAM VAGINALLY 2 TO 3 NIGHTS A WEEK 08/18/16   [provider]  estradiol (ESTRACE) 0.1 MG/GM vaginal cream 1/4 applicatorful per vagina at HS nightly for 2 weeks, then twice weekll]y as needed 01/13/08   [provider]  fluconazole (DIFLUCAN) 200 MG tablet Take 1 tablet (200 mg total) by mouth daily. 10/30/19   Lurene Shadow, PA-C  magic mouthwash w/lidocaine SOLN Take 5 mLs by  mouth 4 (four) times daily as needed for up to 14 days for mouth pain. 11/18/19 12/02/19  Dellia Cloud, MD  magic mouthwash w/lidocaine SOLN Take 5 mLs by mouth 4 (four) times daily as needed for mouth pain (as needed for mouth pain). 11/20/19   Glenford Bayley, MD  metoprolol tartrate (LOPRESSOR) 50 MG tablet Take 50 mg by mouth 2 (two) times daily.    [provider]  Multiple Vitamin (MULTIVITAMIN) capsule Take 1 capsule by mouth every morning.    [provider]  Multiple Vitamins-Minerals (MULTI-VITAMIN MENOPAUSAL) TABS Take 2 tablets by mouth once.      [provider]  NP THYROID 30 MG tablet Take 30 mg by mouth daily. 08/06/19   [provider]  Nutritional Supplements (MELATONIN PO) Take by mouth as needed.      [provider]  phentermine (ADIPEX-P) 37.5 MG tablet Take by mouth. 08/06/19   [provider]  PREBIOTIC PRODUCT PO Take by mouth.    [provider]  Pregnenolone POWD by Does not apply route.    [provider]  PREMARIN vaginal cream Place vaginally. 04/01/19   [provider]  thyroid (ARMOUR) 15 MG tablet Take one (1) 15 mg + one (1) 30 mg tablet for (45 mg Total) QOD  Alternating with (30 mg Total) QOD 08/06/19   [provider]  topiramate (TOPAMAX) 25 MG tablet Take by mouth. 08/06/19   [provider]    Family History Family History  Problem Relation Age of Onset  . COPD Mother   . Heart failure Mother   . Diabetes Father   . Hypertension Father   . Heart failure Father     Social History Social History   Tobacco Use  . Smoking status: Former Smoker    Types: Cigarettes    Quit date: 01/11/2006    Years since quitting: 13.8  . Smokeless tobacco: Never Used  Vaping Use  . Vaping Use: Never used  Substance Use Topics  . Alcohol use: Never    Comment: 1 WINE/ DAILY  . Drug use: No     Allergies   Latex, Nitrofurantoin macrocrystal, Other,  Sulfamethoxazole, Tape, Ibuprofen, Nitrofuran derivatives, and Penicillins   Review of Systems Review of Systems  Constitutional: Negative for activity change, appetite change, chills, diaphoresis, fatigue and fever.  Gastrointestinal: Negative for abdominal pain and nausea.  Genitourinary: Positive for dysuria, frequency and urgency. Negative for difficulty urinating, flank pain, hematuria and pelvic pain.  All other systems reviewed and are negative.    Physical Exam Triage Vital Signs ED Triage Vitals  Enc Vitals Group     BP 11/25/19 1209 117/76     Pulse Rate 11/25/19 1209 81     Resp --      Temp 11/25/19 1209 98.1 F (36.7 C)     Temp Source 11/25/19 1209 Oral     SpO2 11/25/19 1209 95 %  Weight 11/25/19 1210 173 lb (78.5 kg)     Height 11/25/19 1210 5\' 5"  (1.651 m)     Head Circumference --      Peak Flow --      Pain Score 11/25/19 1210 5     Pain Loc --      Pain Edu? --      Excl. in GC? --    No data found.  Updated Vital Signs BP 117/76 (BP Location: Right Arm)   Pulse 81   Temp 98.1 F (36.7 C) (Oral)   Ht 5\' 5"  (1.651 m)   Wt 78.5 kg   SpO2 95%   BMI 28.79 kg/m   Visual Acuity Right Eye Distance:   Left Eye Distance:   Bilateral Distance:    Right Eye Near:   Left Eye Near:    Bilateral Near:     Physical Exam Nursing notes and Vital Signs reviewed. Appearance:  Patient appears stated age, and in no acute distress.    Eyes:  Pupils are equal, round, and reactive to light and accomodation.  Extraocular movement is intact.  Conjunctivae are not inflamed   Pharynx:  Normal; moist mucous membranes  Neck:  Supple.  No adenopathy Lungs:  Clear to auscultation.  Breath sounds are equal.  Moving air well. Heart:  Regular rate and rhythm without murmurs, rubs, or gallops.  Abdomen:  Nontender without masses or hepatosplenomegaly.  Bowel sounds are present.  No CVA or flank tenderness.  Extremities:  No edema.  Skin:  No rash present.     UC  Treatments / Results  Labs (all labs ordered are listed, but only abnormal results are displayed) Labs Reviewed  POCT URINALYSIS DIP (MANUAL ENTRY) - Abnormal; Notable for the following components:      Result Value   Clarity, UA cloudy (*)    Blood, UA moderate (*)    Protein Ur, POC trace (*)    Leukocytes, UA Moderate (2+) (*)    All other components within normal limits  URINE CULTURE    EKG   Radiology No results found.  Procedures Procedures (including critical care time)  Medications Ordered in UC Medications - No data to display  Initial Impression / Assessment and Plan / UC Course  I have reviewed the triage vital signs and the nursing notes.  Pertinent labs & imaging results that were available during my care of the patient were reviewed by me and considered in my medical decision making (see chart for details).    Urine culture pending. Begin Cipro. Followup with Family Doctor if not improved in one week.    Final Clinical Impressions(s) / UC Diagnoses   Final diagnoses:  Recurrent UTI  Cystitis     Discharge Instructions     Increase fluid intake. May use non-prescription AZO for about two days, if desired, to decrease urinary discomfort.  If symptoms become significantly worse during the night or over the weekend, proceed to the local emergency room.     ED Prescriptions    Medication Sig Dispense Auth. Provider   ciprofloxacin (CIPRO) 500 MG tablet Take one tab PO Q12hr 14 tablet 13/02/21, MD        , MD 11/29/19 [phone]

## 2019-11-25 NOTE — ED Triage Notes (Signed)
Dysuria x 3 days 

## 2019-11-27 LAB — URINE CULTURE
MICRO NUMBER:: 11148691
SPECIMEN QUALITY:: ADEQUATE

## 2019-12-01 NOTE — Progress Notes (Signed)
Internal Medicine Clinic Attending  Case discussed with Dr. Marchia Bond  At the time of the visit.  We reviewed the resident's history and I agree with the assessment, diagnosis, and plan of care documented in the resident's note.

## 2020-08-27 ENCOUNTER — Emergency Department (INDEPENDENT_AMBULATORY_CARE_PROVIDER_SITE_OTHER)
Admission: EM | Admit: 2020-08-27 | Discharge: 2020-08-27 | Disposition: A | Discharge status: Home / Self Care | Payer: Medicare Other | Source: Home / Self Care | Attending: Family Medicine | Admitting: Family Medicine

## 2020-08-27 ENCOUNTER — Other Ambulatory Visit: Payer: Self-pay

## 2020-08-27 DIAGNOSIS — N3090 Cystitis, unspecified without hematuria: Secondary | ICD-10-CM

## 2020-08-27 DIAGNOSIS — N3001 Acute cystitis with hematuria: Secondary | ICD-10-CM | POA: Diagnosis not present

## 2020-08-27 HISTORY — DX: Essential (primary) hypertension: I10

## 2020-08-27 LAB — POCT URINALYSIS DIP (MANUAL ENTRY)
Bilirubin, UA: NEGATIVE
Glucose, UA: NEGATIVE mg/dL
Nitrite, UA: NEGATIVE
Protein Ur, POC: NEGATIVE mg/dL
Spec Grav, UA: 1.025 (ref 1.010–1.025)
Urobilinogen, UA: 0.2 E.U./dL
pH, UA: 5.5 (ref 5.0–8.0)

## 2020-08-27 MED ORDER — CIPROFLOXACIN HCL 500 MG PO TABS: ORAL_TABLET | ORAL | 0 refills | Status: DC

## 2020-08-27 NOTE — Discharge Instructions (Signed)
Increase fluid intake. Recommend taking a probiotic while taking Cipro.

## 2020-08-27 NOTE — ED Provider Notes (Signed)
[facility]    CSN: [id number] Arrival date & time: 08/27/20  1908      History   Chief Complaint Chief Complaint  Patient presents with   Dysuria    HPI Mikayla French is a 65 y.o. female.   Patient developed recurrent urinary symptoms about two weeks ago, and started Cipro that she had on hand.  She finished it five days ago.  Her symptoms improved but did not completely resolved.  She has a history of recurring UTI's, reporting that they always resolved if she take a longer course of Cipro, usually at least 10 days.  She feels well otherwise, denying abdominal/pelvic pain, fevers, chills, and sweats, and nausea/vomiting.  The history is provided by the patient.  Dysuria Pain quality:  Burning Pain severity:  Mild Onset quality:  Gradual Duration:  2 weeks Timing:  Constant Progression:  Partially resolved Chronicity:  Recurrent Recent urinary tract infections: yes   Relieved by:  Nothing Worsened by:  Nothing Ineffective treatments:  Antibiotics Urinary symptoms: frequent urination and hesitancy   Urinary symptoms: no discolored urine, no foul-smelling urine, no hematuria and no bladder incontinence   Associated symptoms: no abdominal pain, no fever, no flank pain, no nausea and no vaginal discharge   Risk factors: recurrent urinary tract infections    Past Medical History:  Diagnosis Date   Arthritis    Back pain occasional   post decompression w/ chiropractor --  w/ good improvement   Bunion of right foot    GERD (gastroesophageal reflux disease) occ. reflux   watches diet/  takes pickle juice   Hypertension     Patient Active Problem List   Diagnosis Date Noted   Acute deep vein thrombosis (DVT) (HCC) 11/21/2019   A-fib (HCC) 11/21/2019   Oral candidiasis 11/19/2019   Prediabetes 08/29/2019   Former smoker 08/29/2019   Peripheral neuropathy 10/11/2018   Age-related nuclear cataract of both eyes 03/19/2018   Dermatochalasis of both upper  eyelids 03/19/2018   Blepharitis of upper and lower eyelids of both eyes 03/19/2018   Dry eye syndrome of both lacrimal glands 03/19/2018   Hypertensive retinopathy of both eyes 03/19/2018   Meibomian gland dysfunction (MGD) of both eyes 03/19/2018   Allergic rhinitis 01/25/2018   Bulging discs 01/25/2018   Endometriosis 01/25/2018   Acquired hypothyroidism 04/21/2017   Complex tear of medial meniscus of right knee as current injury 02/07/2016   Recurrent UTI 03/28/2011   Hematuria of undiagnosed cause 01/31/2011    Past Surgical History:  Procedure Laterality Date   BUNIONECTOMY  10-04-2010   LEFT FOOT   BUNIONECTOMY  01/18/2011   Procedure: Arbutus Leas;  Surgeon: Illene Labrador [place];  Location: [facility];  Service: Orthopedics;  Laterality: Right;   EXCISION MORTON'S NEUROMA  10-04-2010   LEFT --  THIRD- [facility]   REMOVAL BAKER'S CYST RIGHT KNEE  6 YRS AGO    OB History   No obstetric history on file.      Home Medications    Prior to Admission medications   Medication Sig Start Date End Date Taking? Authorizing Provider  APIXABAN Everlene Balls) VTE STARTER PACK (10MG  AND 5MG ) Take as directed on package: start with two-5mg  tablets twice daily for 7 days. On day 8, switch to one-5mg  tablet twice daily. 11/14/19  Yes Phelps, Erin O, PA-C  b complex vitamins capsule Take by mouth.   Yes [provider]  Cholecalciferol 1.25 MG (50000 UT) TABS Take by mouth.  Yes [provider]  clobetasol cream (TEMOVATE) 0.05 % Use as directed bid 11/20/16  Yes [provider]  conjugated estrogens (PREMARIN) vaginal cream USE ONE-HALF (1/2) GRAM VAGINALLY 2 TO 3 NIGHTS A WEEK 08/18/16  Yes [provider]  estradiol (ESTRACE) 0.1 MG/GM vaginal cream 1/4 applicatorful per vagina at HS nightly for 2 weeks, then twice weekll]y as needed 01/13/08  Yes [provider]  metoprolol tartrate (LOPRESSOR) 50 MG tablet Take 50 mg by  mouth 2 (two) times daily.   Yes [provider]  Multiple Vitamin (MULTIVITAMIN) capsule Take 1 capsule by mouth every morning.   Yes [provider]  Multiple Vitamins-Minerals (MULTI-VITAMIN MENOPAUSAL) TABS Take 2 tablets by mouth once.     Yes [provider]  NP THYROID 30 MG tablet Take 30 mg by mouth daily. 08/06/19  Yes [provider]  Nutritional Supplements (MELATONIN PO) Take by mouth as needed.     Yes [provider]  Pregnenolone POWD by Does not apply route.   Yes [provider]  PREMARIN vaginal cream Place vaginally. 04/01/19  Yes [provider]  thyroid (ARMOUR) 15 MG tablet Take one (1) 15 mg + one (1) 30 mg tablet for (45 mg Total) QOD  Alternating with (30 mg Total) QOD 08/06/19  Yes [provider]  topiramate (TOPAMAX) 25 MG tablet Take by mouth. 08/06/19  Yes [provider]  ACIDOPHILUS LACTOBACILLUS PO Take by mouth.    [provider]  atorvastatin (LIPITOR) 20 MG tablet Take 1 tablet (20 mg total) by mouth daily. 09/22/19   Sunnie Nielsen, DO  ciprofloxacin (CIPRO) 500 MG tablet Take one tab PO Q12hr 08/27/20   Lattie Haw, MD  fluconazole (DIFLUCAN) 200 MG tablet Take 1 tablet (200 mg total) by mouth daily. 10/30/19   Lurene Shadow, PA-C  magic mouthwash w/lidocaine SOLN Take 5 mLs by mouth 4 (four) times daily as needed for mouth pain (as needed for mouth pain). 11/20/19   Glenford Bayley, MD  phentermine (ADIPEX-P) 37.5 MG tablet Take by mouth. 08/06/19   [provider]  PREBIOTIC PRODUCT PO Take by mouth.    [provider]    Family History Family History  Problem Relation Age of Onset   COPD Mother    Heart failure Mother    Diabetes Father    Hypertension Father    Heart failure Father     Social History Social History   Tobacco Use   Smoking status: Former    Types: Cigarettes    Quit date: 01/11/2006    Years since quitting: 14.6    Smokeless tobacco: Never  Vaping Use   Vaping Use: Never used  Substance Use Topics   Alcohol use: Never    Comment: 1 WINE/ DAILY   Drug use: No     Allergies   Latex, Nitrofurantoin macrocrystal, Other, Sulfamethoxazole, Tape, Ibuprofen, Nitrofuran derivatives, and Penicillins   Review of Systems Review of Systems  Constitutional:  Negative for chills, diaphoresis, fatigue and fever.  Gastrointestinal:  Negative for abdominal pain and nausea.  Genitourinary:  Positive for dysuria, frequency and urgency. Negative for decreased urine volume, difficulty urinating, flank pain, hematuria and vaginal discharge.  All other systems reviewed and are negative.   Physical Exam Triage Vital Signs ED Triage Vitals  Enc Vitals Group     BP 08/27/20 1936 138/80     Pulse Rate 08/27/20 1936 92     Resp 08/27/20 1936 20  Temp 08/27/20 1936 98.6 F (37 C)     Temp Source 08/27/20 1936 Oral     SpO2 08/27/20 1936 97 %     Weight 08/27/20 1930 187 lb (84.8 kg)     Height 08/27/20 1930 5\' 5"  (1.651 m)     Head Circumference --      Peak Flow --      Pain Score 08/27/20 1929 4     Pain Loc --      Pain Edu? --      Excl. in GC? --    No data found.  Updated Vital Signs BP 138/80 (BP Location: Right Arm)   Pulse 92   Temp 98.6 F (37 C) (Oral)   Resp 20   Ht 5\' 5"  (1.651 m)   Wt 84.8 kg   SpO2 97%   BMI 31.12 kg/m   Visual Acuity Right Eye Distance:   Left Eye Distance:   Bilateral Distance:    Right Eye Near:   Left Eye Near:    Bilateral Near:     Physical Exam Nursing notes and Vital Signs reviewed. Appearance:  Patient appears stated age, and in no acute distress.    Eyes:  Pupils are equal, round, and reactive to light and accomodation.  Extraocular movement is intact.  Conjunctivae are not inflamed   Pharynx:  Normal; moist mucous membranes  Neck:  Supple.  No adenopathy Lungs:  Clear to auscultation.  Breath sounds are equal.  Moving air well. Heart:   Regular rate and rhythm without murmurs, rubs, or gallops.  Abdomen:  Nontender without masses or hepatosplenomegaly.  Bowel sounds are present.  No CVA or flank tenderness.  Extremities:  No edema.  Skin:  No rash present.     UC Treatments / Results  Labs (all labs ordered are listed, but only abnormal results are displayed) Labs Reviewed  POCT URINALYSIS DIP (MANUAL ENTRY) - Abnormal; Notable for the following components:      Result Value   Ketones, POC UA trace (5) (*)    Blood, UA small (*)    Leukocytes, UA Moderate (2+) (*)    All other components within normal limits  URINE CULTURE    EKG   Radiology No results found.  Procedures Procedures (including critical care time)  Medications Ordered in UC Medications - No data to display  Initial Impression / Assessment and Plan / UC Course  I have reviewed the triage vital signs and the nursing notes.  Pertinent labs & imaging results that were available during my care of the patient were reviewed by me and considered in my medical decision making (see chart for details).    Begin Cipro at patient's request.  Urine culture pending. Followup with Family Doctor if not improved in 7 to 10 days.  Final Clinical Impressions(s) / UC Diagnoses   Final diagnoses:  Acute cystitis with hematuria  Cystitis     Discharge Instructions      Increase fluid intake. Recommend taking a probiotic while taking Cipro.     ED Prescriptions     Medication Sig Dispense Auth. Provider   ciprofloxacin (CIPRO) 500 MG tablet Take one tab PO Q12hr 20 tablet 10/27/20, MD         , MD 08/29/20 [phone]

## 2020-08-27 NOTE — ED Triage Notes (Signed)
Burning, Urination Frequency   5-7 days of ciprofloxacin 500mg , finished Sunday   Rebounded still having symptoms

## 2020-08-30 ENCOUNTER — Encounter: Admitting: Osteopathic Medicine

## 2020-08-30 LAB — URINE CULTURE
MICRO NUMBER:: 12209061
SPECIMEN QUALITY:: ADEQUATE

## 2020-08-31 ENCOUNTER — Telehealth (HOSPITAL_COMMUNITY): Payer: Self-pay | Admitting: Emergency Medicine

## 2020-08-31 MED ORDER — DOXYCYCLINE HYCLATE 100 MG PO CAPS: 100.0000 mg | ORAL_CAPSULE | Freq: Two times a day (BID) | ORAL | 0 refills | Status: AC

## 2020-09-08 ENCOUNTER — Telehealth: Payer: Self-pay | Admitting: *Deleted

## 2020-09-08 MED ORDER — FLUCONAZOLE 150 MG PO TABS: ORAL_TABLET | ORAL | 0 refills | Status: DC

## 2020-09-08 NOTE — Telephone Encounter (Signed)
Pt called c/o vaginal itching post ABT for UTI. Per Trevor Iha FNP send in rx for Diflucan 150mg  one now, repeat in 3 days # 2 / 0RF to .

## 2021-05-01 IMAGING — CT CT CHEST LUNG CANCER SCREENING LOW DOSE W/O CM
2 of 4 series · 15 of 36 positions shown, 18 images · non-contrast
Comparison: None.

CLINICAL DATA: Lung cancer screening. Forty pack-year history.
Former asymptomatic smoker.

EXAM:
CT CHEST WITHOUT CONTRAST LOW-DOSE FOR LUNG CANCER SCREENING
TECHNIQUE: Multidetector CT imaging of the chest was performed following the
standard protocol without IV contrast.

[Series 4: coronal · coronal · 0.63mm/px · 3 of 253 slices shown]
[im 51/253  lung]
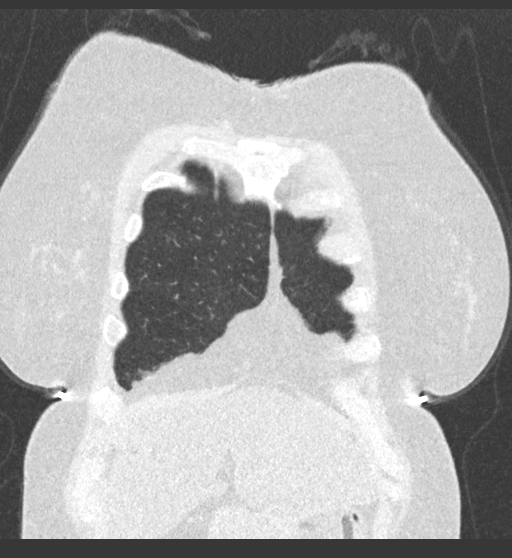
[im 101/253  lung]
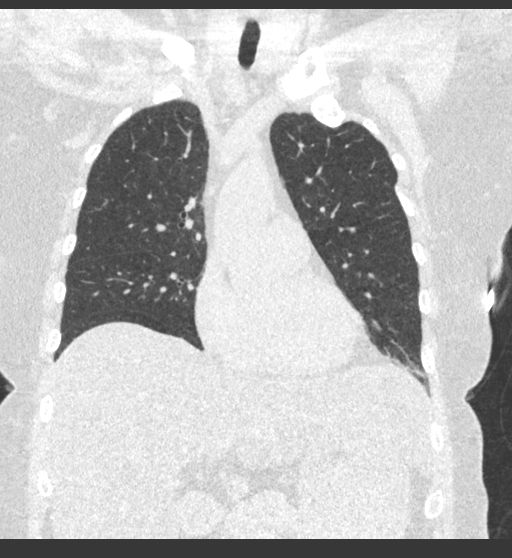
[im 152/253  lung]
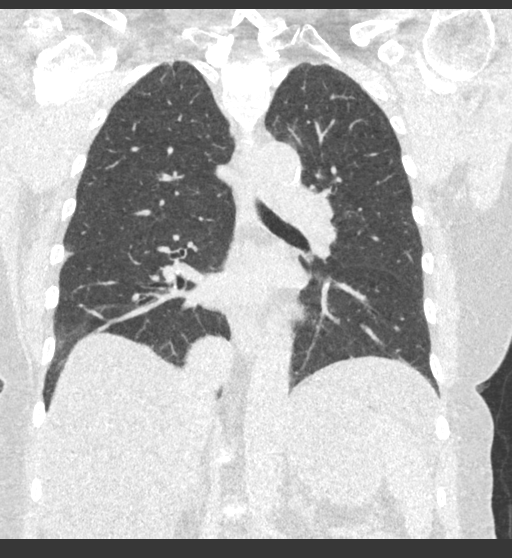

[Series 6: lungs · axial · 0.66mm/px · z∈[-335,-32]mm · 12 of 335 slices shown, 15 images]
[im 16/335  mediastinal]
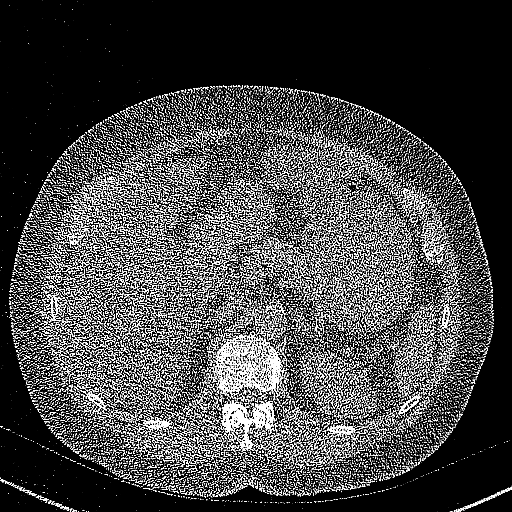
[im 16/335  lung]
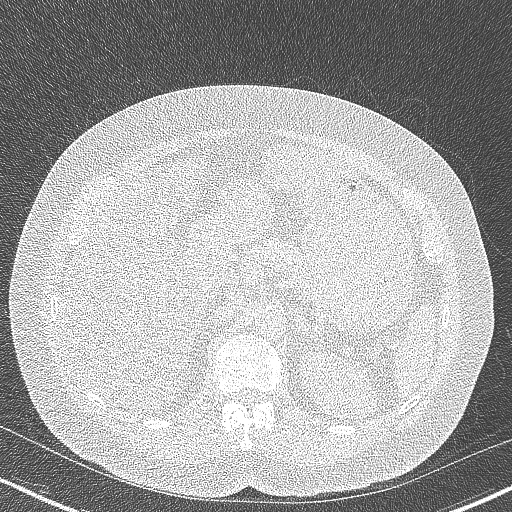
[im 46/335  lung]
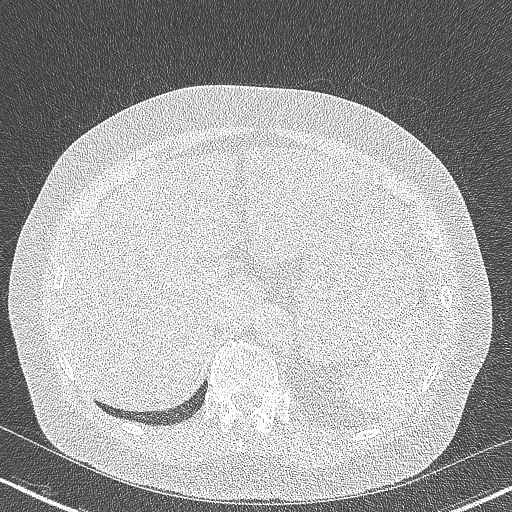
[im 76/335  lung]
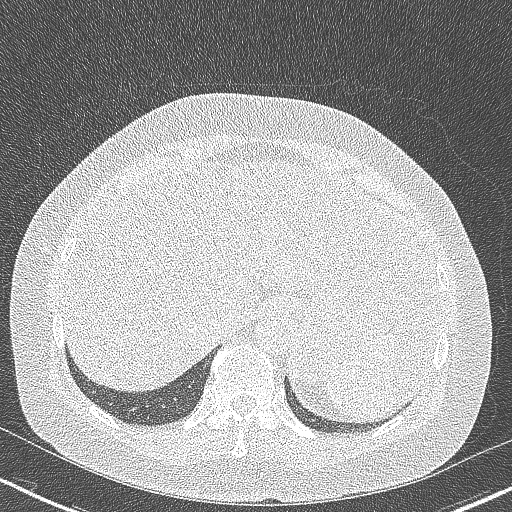
[im 107/335  lung]
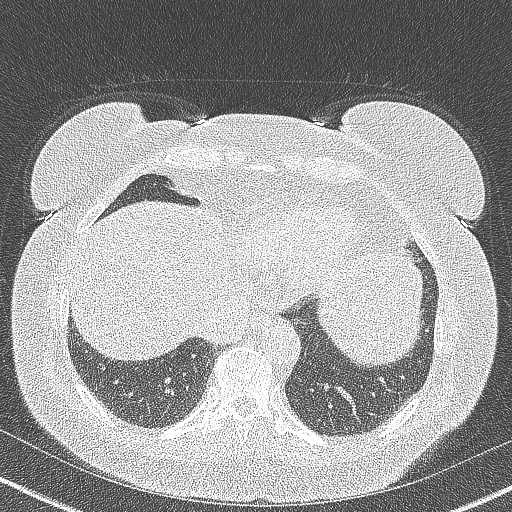
[im 122/335  mediastinal]
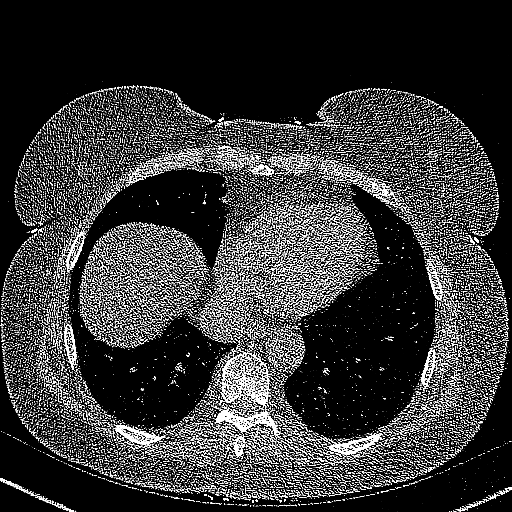
[im 122/335  lung]
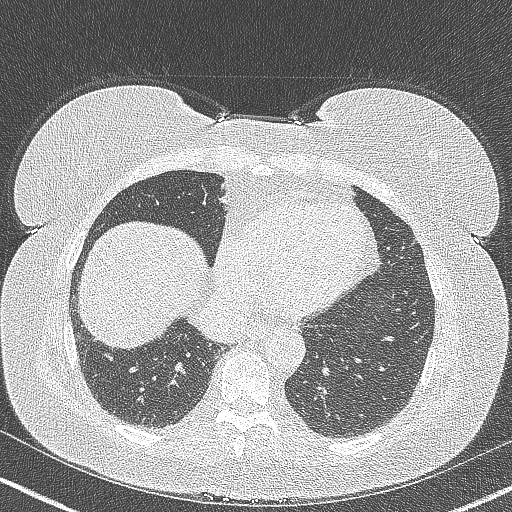
[im 152/335  lung]
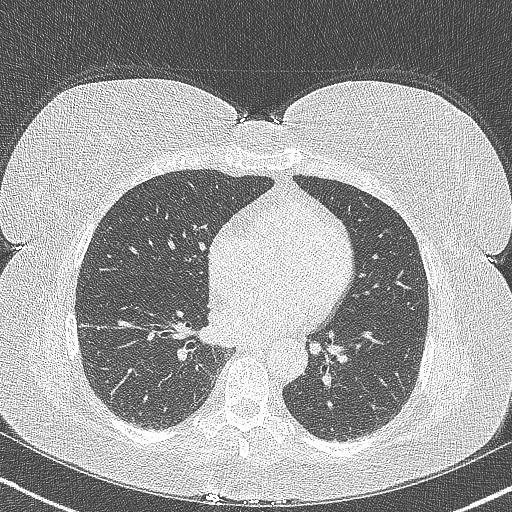
[im 183/335  lung]
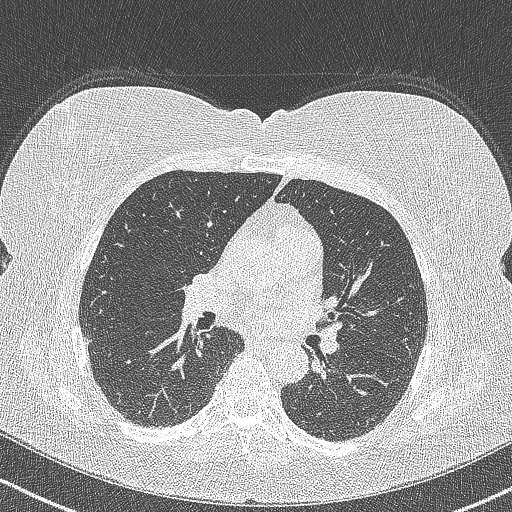
[im 213/335  lung]
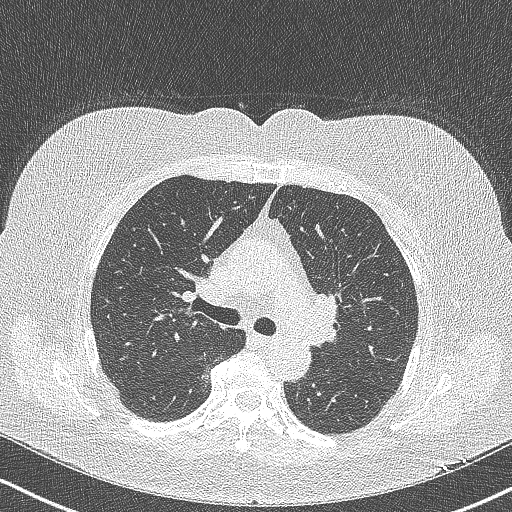
[im 228/335  mediastinal]
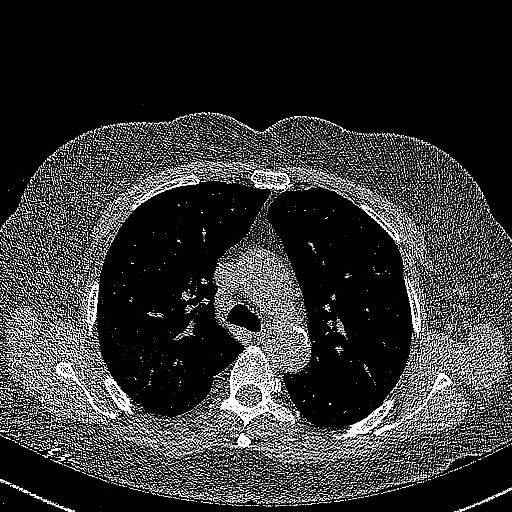
[im 228/335  lung]
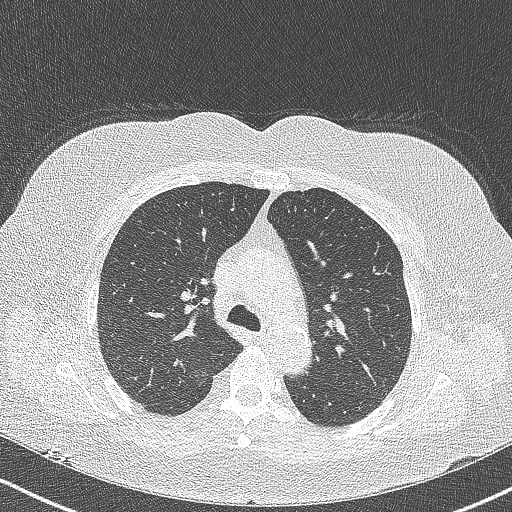
[im 259/335  lung]
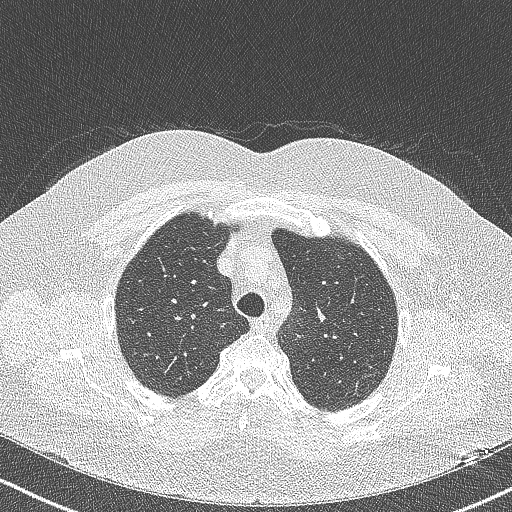
[im 289/335  lung]
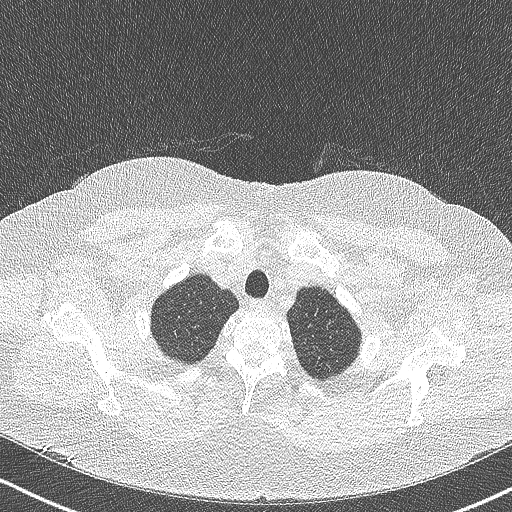
[im 319/335  lung]
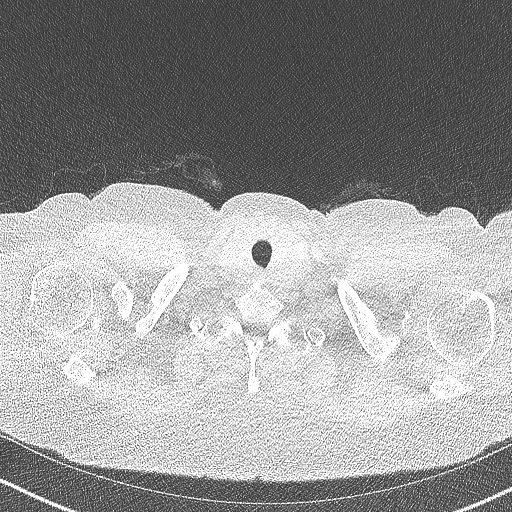

[15 of 36 positions shown; findings below may reference images not displayed]

FINDINGS: Cardiovascular: The heart size appears normal. No pericardial
effusion. Mild aortic atherosclerosis.

Mediastinum/Nodes: No enlarged mediastinal, hilar, or axillary lymph
nodes. Thyroid gland, trachea, and esophagus demonstrate no
significant findings.

Lungs/Pleura: No pleural effusion. Mild changes of centrilobular
emphysema. Scar identified the right lower lobe. No suspicious
pulmonary nodules identified at this time.

Upper Abdomen: Diffuse hepatic steatosis.

Musculoskeletal: Degenerative disc disease noted within the thoracic
spine. No acute or aggressive bone lesions.
IMPRESSION: 1. Lung-RADS 1, negative. Continue annual screening with low-dose
chest CT without contrast in 12 months.
2. Hepatic steatosis.
3. Emphysema and aortic atherosclerosis.

Aortic Atherosclerosis (9Y01O-HJT.T) and Emphysema (9Y01O-0A5.R).

## 2021-06-27 IMAGING — DX DG CHEST 2V
2 series · 2 of 2 positions shown · non-contrast
Comparison: CT 09/03/2019

CLINICAL DATA: Hypoxia

EXAM:
CHEST - 2 VIEW

[chest pa]
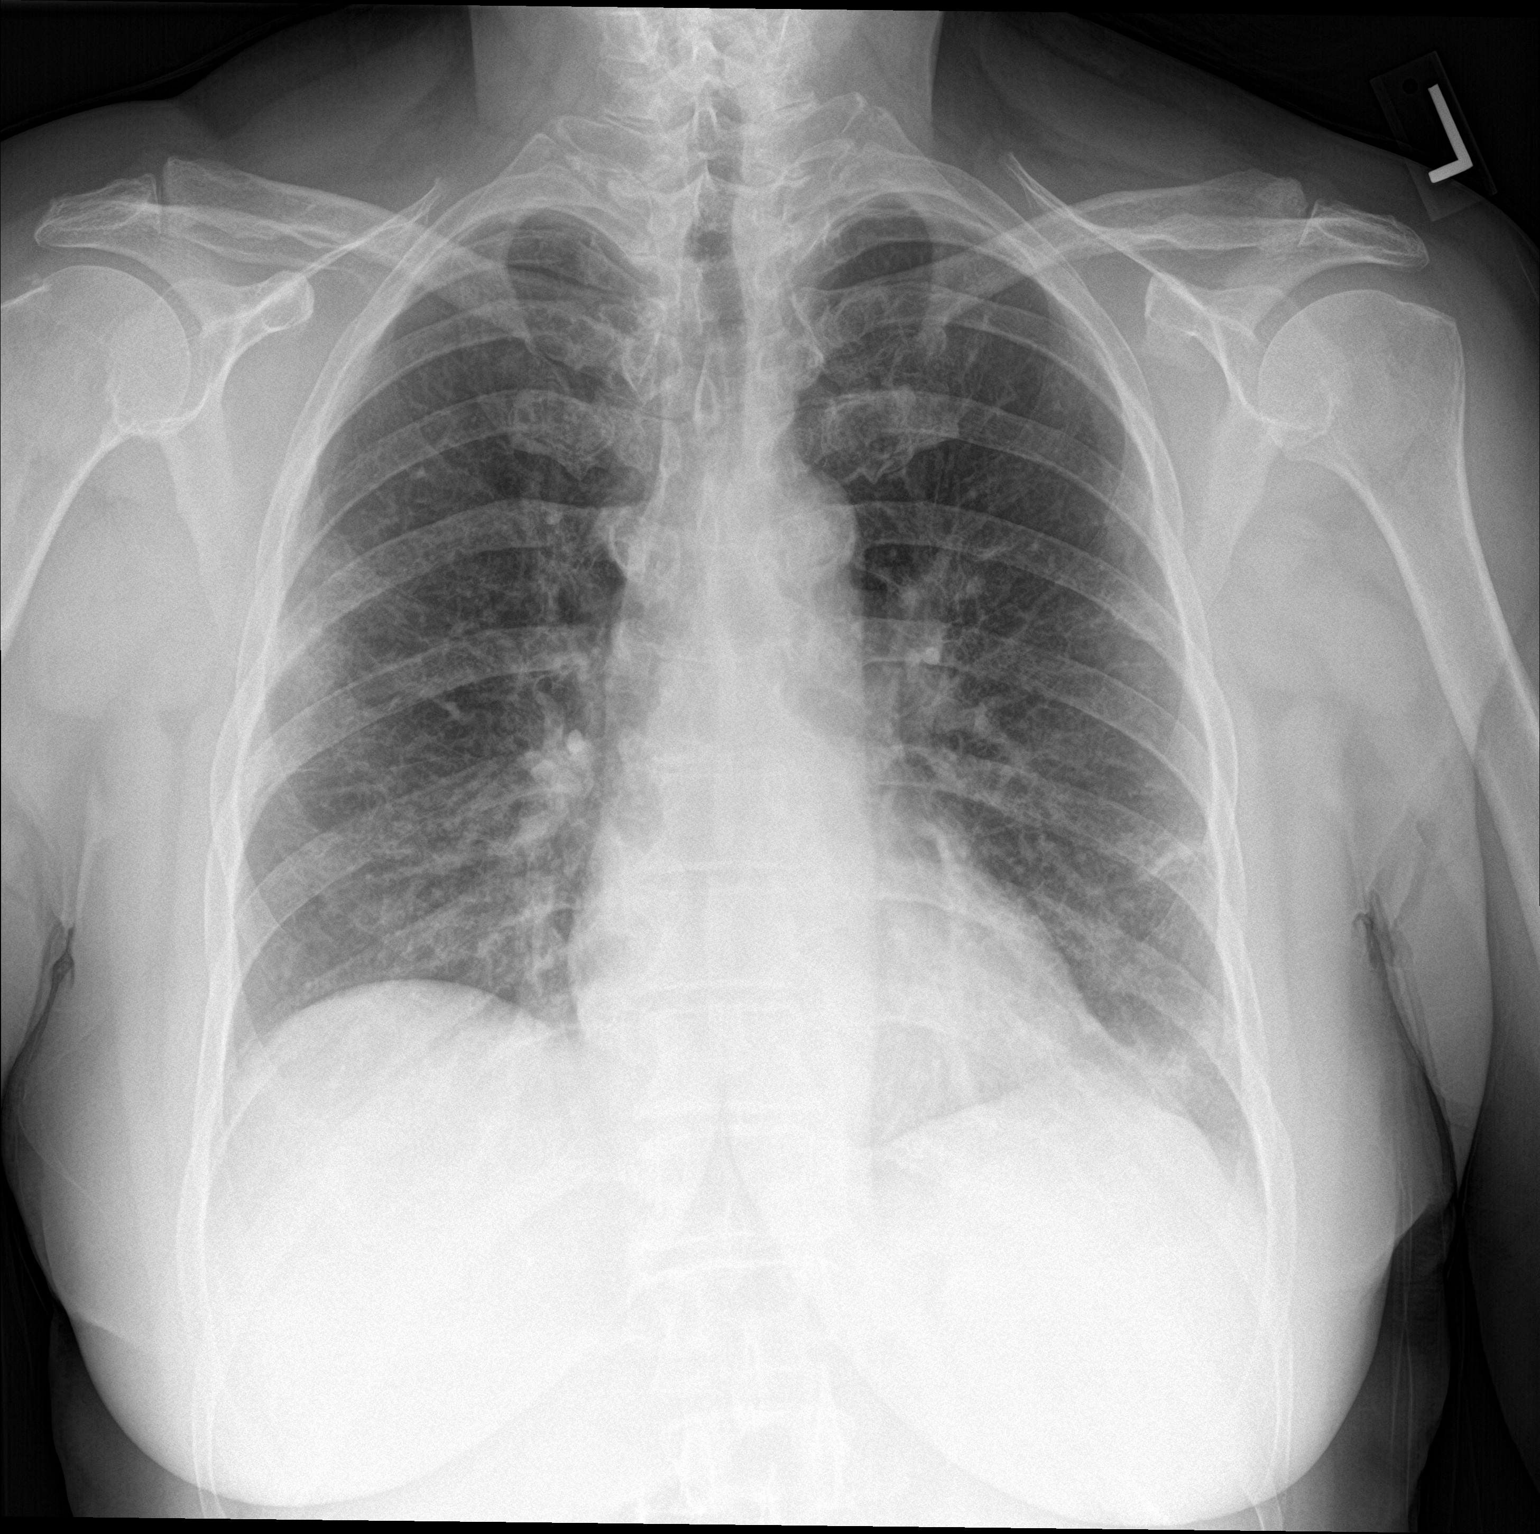

[chest lat]
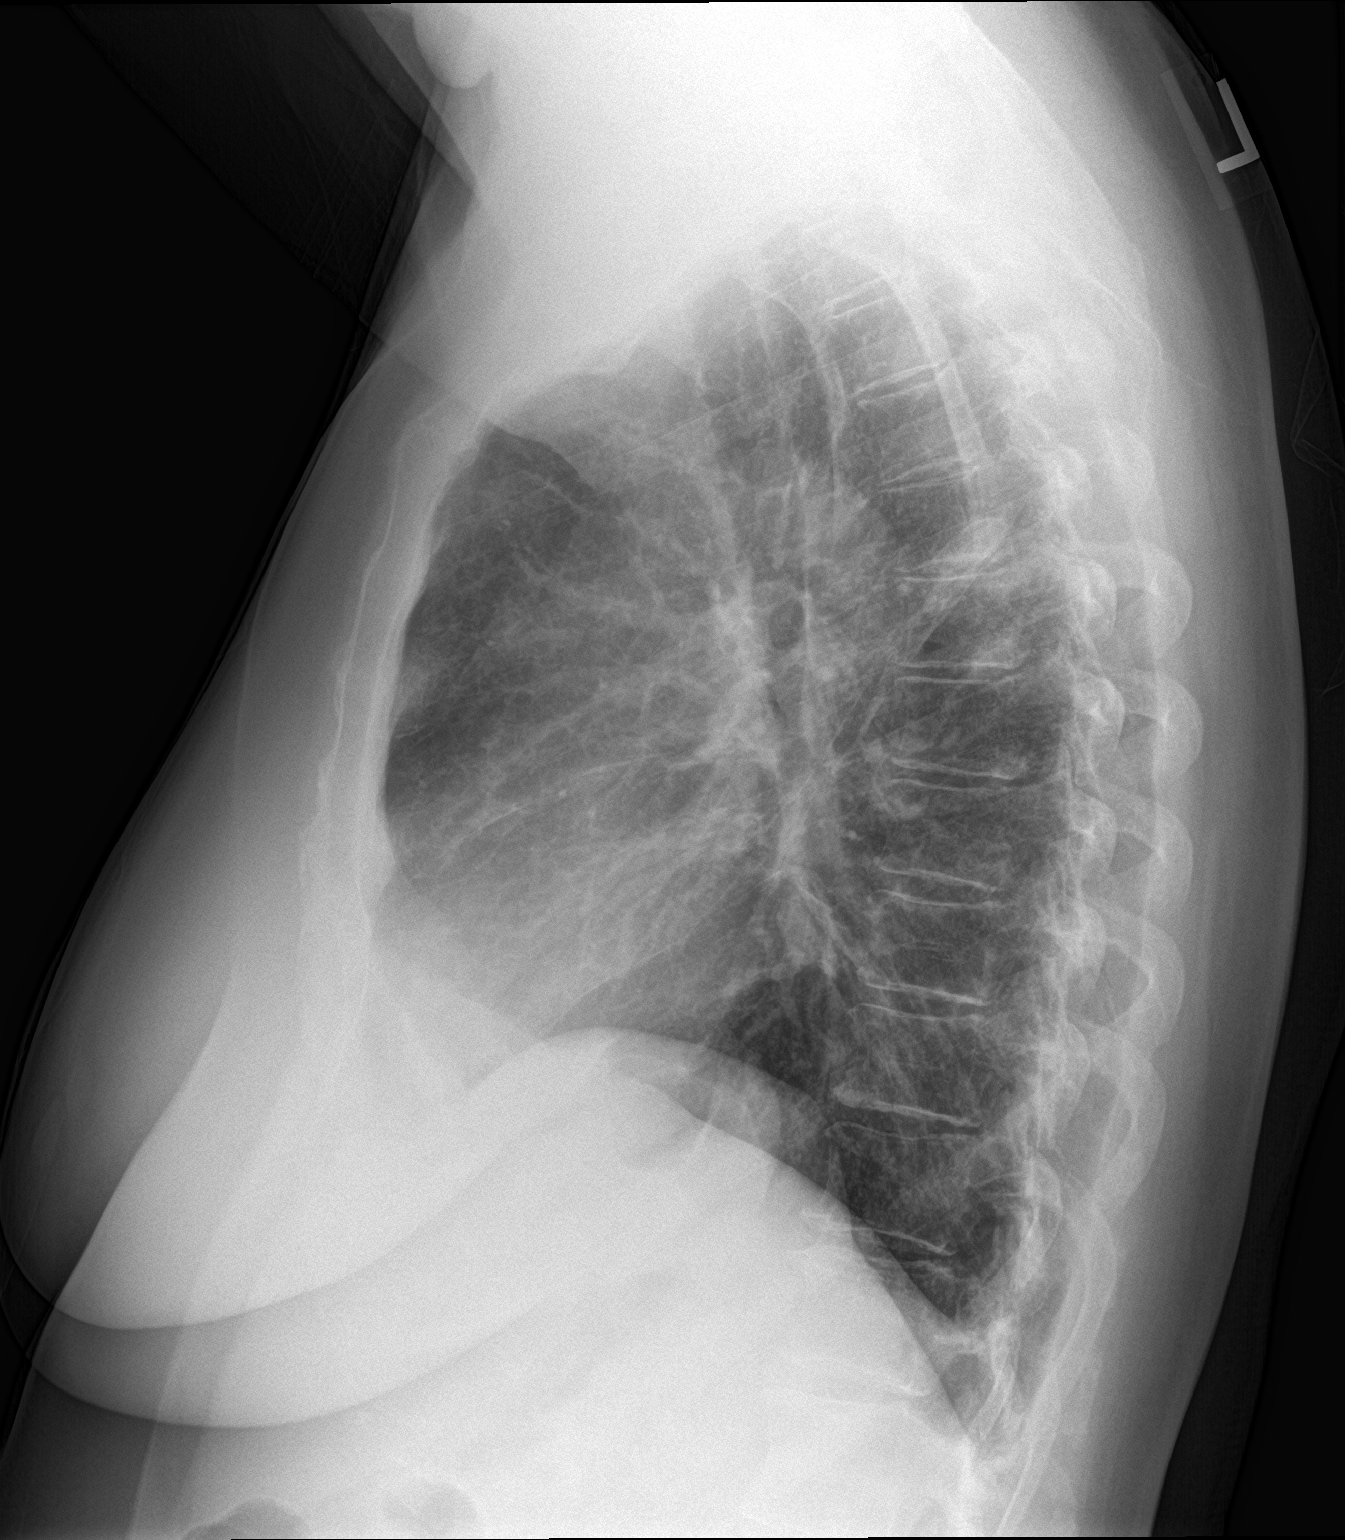

[2 of 2 positions shown; findings below may reference images not displayed]

FINDINGS: Patchy pulmonary infiltrate has developed peripherally within the
right mid lung zone and left lung base, possibly infectious or
inflammatory in the appropriate clinical setting. The lungs are well
expanded and are symmetric. No pneumothorax or pleural effusion.
Cardiac size within normal limits. Pulmonary vascularity is normal.
No acute bone abnormality.
IMPRESSION: Multifocal peripheral pulmonary infiltrates, likely infectious or
inflammatory in the acute setting.

## 2021-07-12 ENCOUNTER — Emergency Department (INDEPENDENT_AMBULATORY_CARE_PROVIDER_SITE_OTHER)
Admission: EM | Admit: 2021-07-12 | Discharge: 2021-07-12 | Disposition: A | Discharge status: Home / Self Care | Payer: Medicare Other | Source: Home / Self Care | Attending: Family Medicine | Admitting: Family Medicine

## 2021-07-12 ENCOUNTER — Encounter: Payer: Self-pay | Admitting: *Deleted

## 2021-07-12 DIAGNOSIS — N3090 Cystitis, unspecified without hematuria: Secondary | ICD-10-CM | POA: Diagnosis not present

## 2021-07-12 DIAGNOSIS — N390 Urinary tract infection, site not specified: Secondary | ICD-10-CM | POA: Diagnosis not present

## 2021-07-12 LAB — POCT URINALYSIS DIP (MANUAL ENTRY)
Bilirubin, UA: NEGATIVE
Glucose, UA: NEGATIVE mg/dL
Ketones, POC UA: NEGATIVE mg/dL
Leukocytes, UA: NEGATIVE
Nitrite, UA: NEGATIVE
Protein Ur, POC: NEGATIVE mg/dL
Spec Grav, UA: 1.02 (ref 1.010–1.025)
Urobilinogen, UA: 0.2 E.U./dL
pH, UA: 7 (ref 5.0–8.0)

## 2021-07-12 IMAGING — US US EXTREM LOW VENOUS*R*
1 series · 13 of 24 positions shown · non-contrast
Comparison: None.

CLINICAL DATA: Right calf pain and swelling.



[Series 1: us extrem low venous*right* · 13 of 32 slices shown]
[im 1/32]
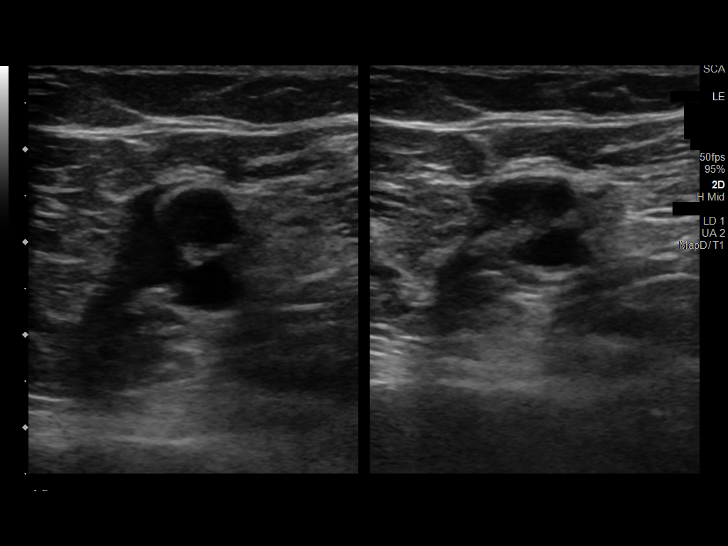
[im 3/32]
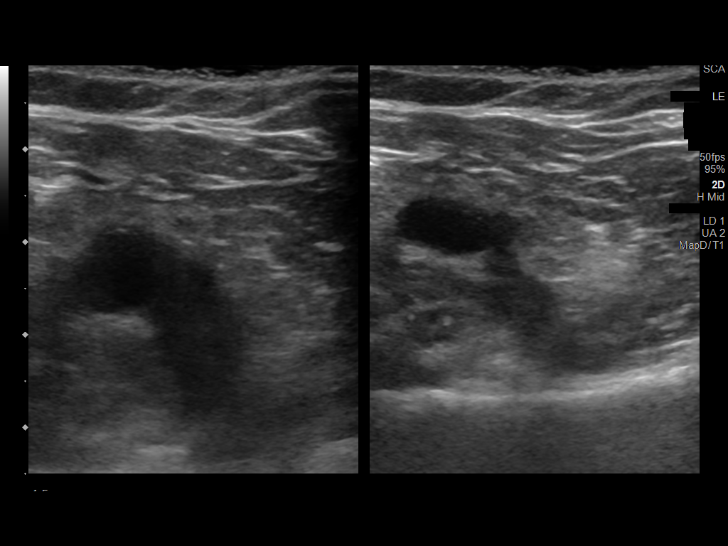
[im 6/32]
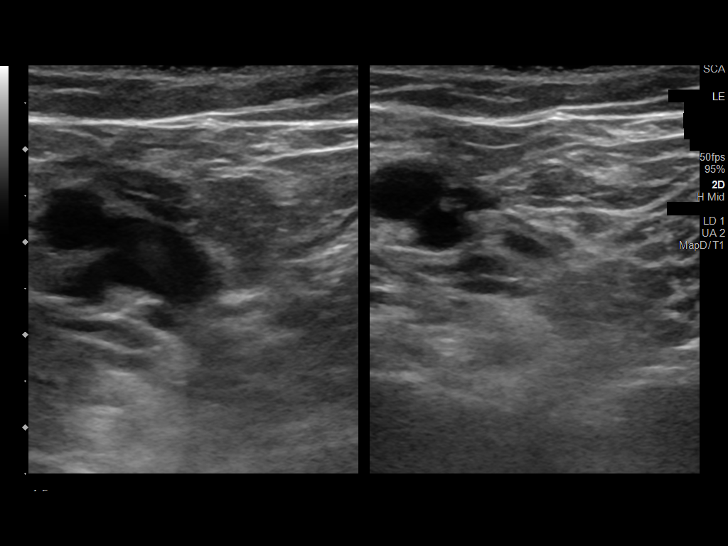
[im 9/32]
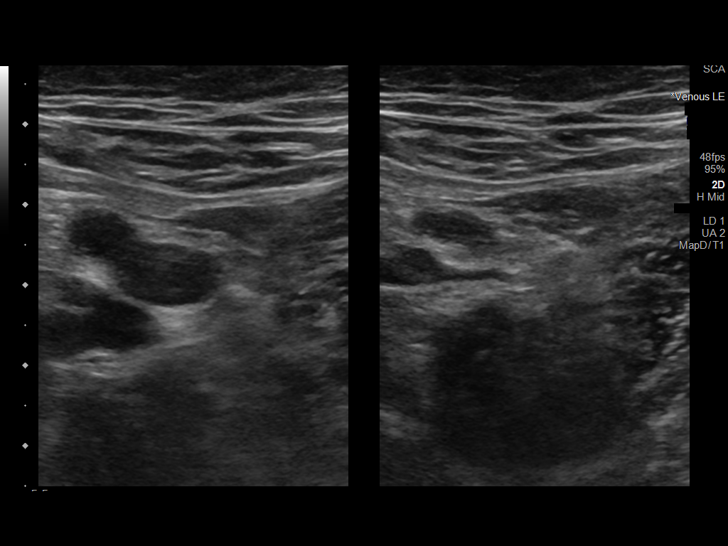
[im 11/32]
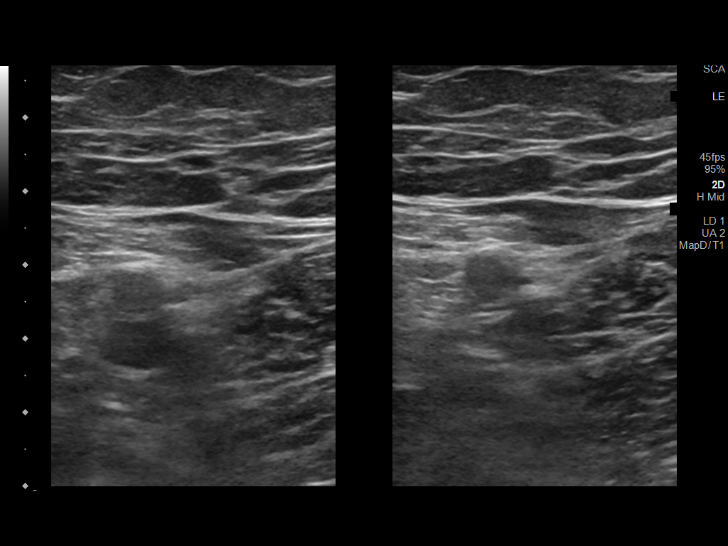
[im 14/32]
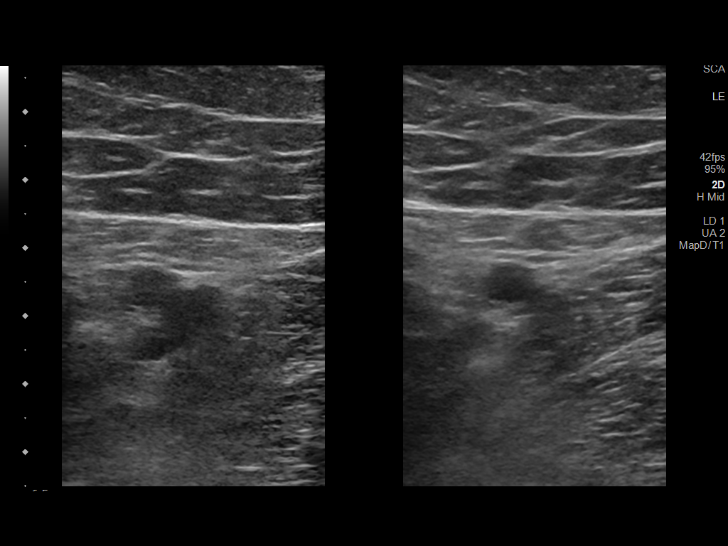
[im 17/32]
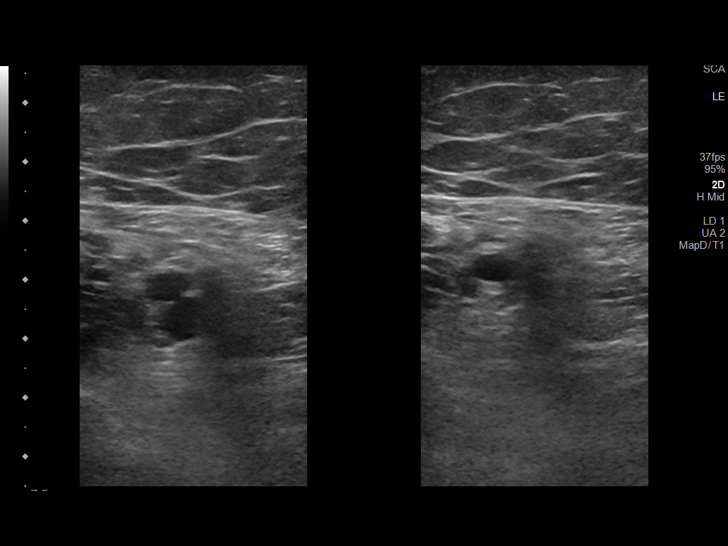
[im 18/32]
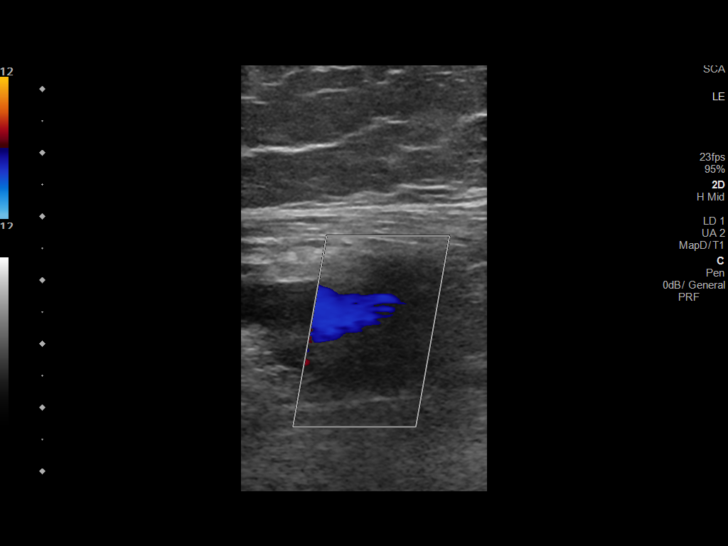
[im 21/32]
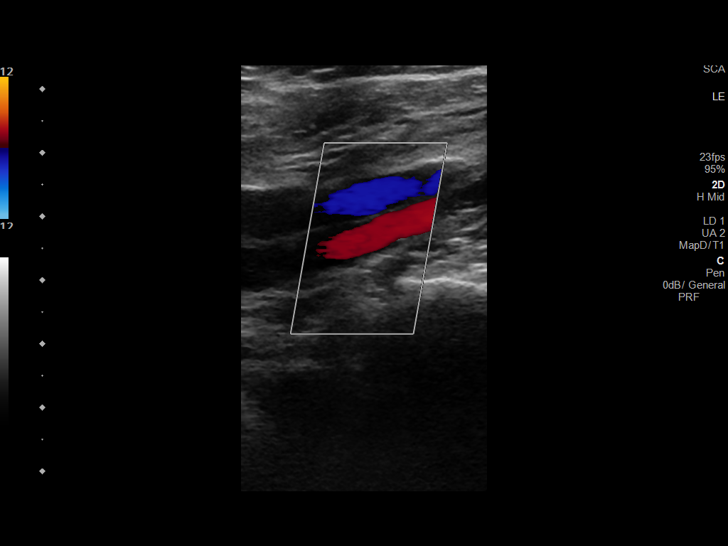
[im 23/32]
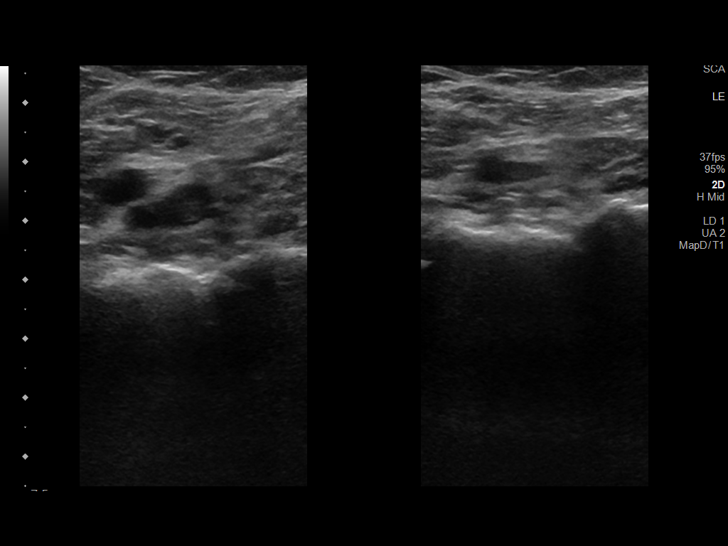
[im 26/32]
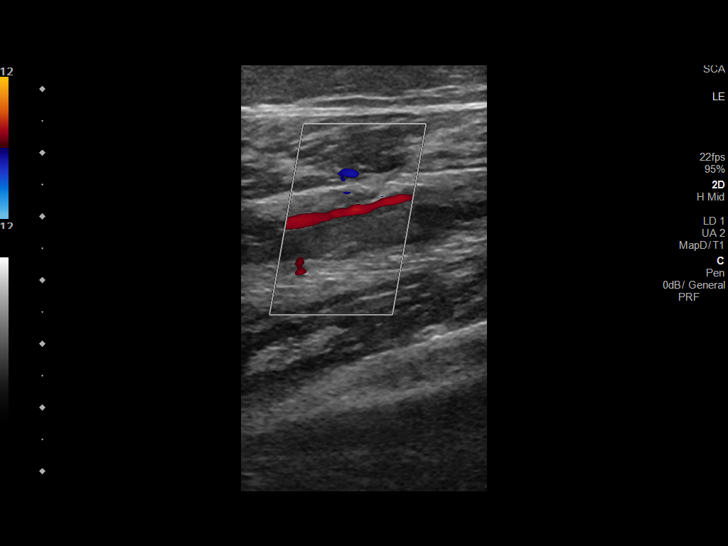
[im 29/32]
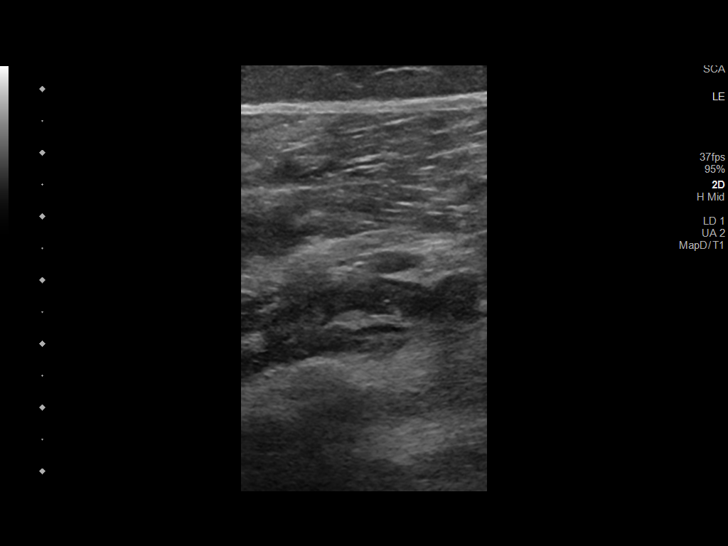
[im 32/32]
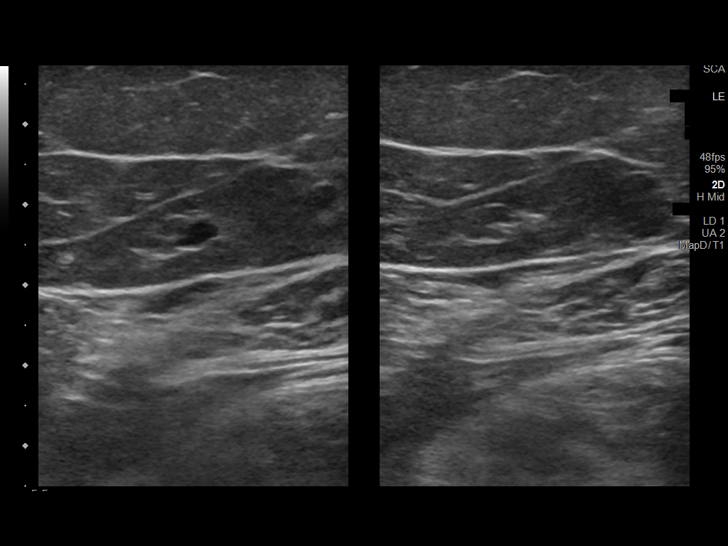

[13 of 24 positions shown; findings below may reference images not displayed]

FINDINGS: Contralateral Common Femoral Vein: Respiratory phasicity is normal
and symmetric with the symptomatic side. No evidence of thrombus.
Normal compressibility.

Common Femoral Vein: No evidence of thrombus. Normal
compressibility, respiratory phasicity and response to augmentation.

Saphenofemoral Junction: No evidence of thrombus. Normal
compressibility and flow on color Doppler imaging.

Profunda Femoral Vein: No evidence of thrombus. Normal
compressibility and flow on color Doppler imaging.

Femoral Vein: No evidence of thrombus. Normal compressibility,
respiratory phasicity and response to augmentation.

Popliteal Vein: No evidence of thrombus. Normal compressibility,
respiratory phasicity and response to augmentation.

Calf Veins: Posterior tibial and peroneal veins are noncompressible
consistent with deep venous thrombosis.

Superficial Great Saphenous Vein: No evidence of thrombus. Normal
compressibility.

Venous Reflux:  None.

Other Findings:  None.
IMPRESSION: Acute deep venous thrombosis is seen involving the right posterior
tibial and peroneal veins. These results will be called to the
ordering clinician or representative by the Radiologist Assistant,
and communication documented in the PACS or zVision Dashboard.

## 2021-07-12 MED ORDER — CEPHALEXIN 500 MG PO CAPS: 500.0000 mg | ORAL_CAPSULE | Freq: Two times a day (BID) | ORAL | 0 refills | Status: DC

## 2021-07-12 NOTE — ED Provider Notes (Signed)
[facility]    CSN: [id number] Arrival date & time: 07/12/21  0954      History   Chief Complaint Chief Complaint  Patient presents with   Dysuria   Urinary Frequency    HPI Mikayla French is a 66 y.o. female.   HPI  Patient states she has a history of recurring urinary tract infections.  She currently has frequency, burning, and bladder pain and pressure for about a week.  Also has some generalized low back aching.  No flank pain.  No fever or chills.  No nausea or vomiting.  No history of kidney stone or kidney infection.    Past Medical History:  Diagnosis Date   Arthritis    Back pain occasional   post decompression w/ chiropractor --  w/ good improvement   Bunion of right foot    GERD (gastroesophageal reflux disease) occ. reflux   watches diet/  takes pickle juice   Hypertension     Patient Active Problem List   Diagnosis Date Noted   Acute deep vein thrombosis (DVT) (HCC) 11/21/2019   A-fib (HCC) 11/21/2019   Oral candidiasis 11/19/2019   Prediabetes 08/29/2019   Former smoker 08/29/2019   Peripheral neuropathy 10/11/2018   Age-related nuclear cataract of both eyes 03/19/2018   Dermatochalasis of both upper eyelids 03/19/2018   Blepharitis of upper and lower eyelids of both eyes 03/19/2018   Dry eye syndrome of both lacrimal glands 03/19/2018   Hypertensive retinopathy of both eyes 03/19/2018   Meibomian gland dysfunction (MGD) of both eyes 03/19/2018   Allergic rhinitis 01/25/2018   Bulging discs 01/25/2018   Endometriosis 01/25/2018   Acquired hypothyroidism 04/21/2017   Complex tear of medial meniscus of right knee as current injury 02/07/2016   Recurrent UTI 03/28/2011   Hematuria of undiagnosed cause 01/31/2011    Past Surgical History:  Procedure Laterality Date   BUNIONECTOMY  10-04-2010   LEFT FOOT   BUNIONECTOMY  01/18/2011   Procedure: Arbutus Leas;  Surgeon: Illene Labrador [place];  Location: [facility];   Service: Orthopedics;  Laterality: Right;   EXCISION MORTON'S NEUROMA  10-04-2010   LEFT --  THIRD- [facility]   REMOVAL BAKER'S CYST RIGHT KNEE  6 YRS AGO    OB History   No obstetric history on file.      Home Medications    Prior to Admission medications   Medication Sig Start Date End Date Taking? Authorizing Provider  cephALEXin (KEFLEX) 500 MG capsule Take 1 capsule (500 mg total) by mouth 2 (two) times daily. 07/12/21  Yes Eustace Moore, MD  ACIDOPHILUS LACTOBACILLUS PO Take by mouth.    [provider]  APIXABAN Everlene Balls) VTE STARTER PACK (10MG  AND 5MG ) Take as directed on package: start with two-5mg  tablets twice daily for 7 days. On day 8, switch to one-5mg  tablet twice daily. 11/14/19   , PA-C  atorvastatin (LIPITOR) 20 MG tablet Take 1 tablet (20 mg total) by mouth daily. 09/22/19   Lurene Shadow, DO  b complex vitamins capsule Take by mouth.    [provider]  Cholecalciferol 1.25 MG (50000 UT) TABS Take by mouth.    [provider]  clobetasol cream (TEMOVATE) 0.05 % Use as directed bid 11/20/16   [provider]  conjugated estrogens (PREMARIN) vaginal cream USE ONE-HALF (1/2) GRAM VAGINALLY 2 TO 3 NIGHTS A WEEK 08/18/16   [provider]  metoprolol tartrate (LOPRESSOR) 50 MG tablet Take 50 mg by  mouth 2 (two) times daily.    [provider]  Multiple Vitamins-Minerals (MULTI-VITAMIN MENOPAUSAL) TABS Take 2 tablets by mouth once.      [provider]  NP THYROID 30 MG tablet Take 30 mg by mouth daily. 08/06/19   [provider]  Nutritional Supplements (MELATONIN PO) Take by mouth as needed.      [provider]  phentermine (ADIPEX-P) 37.5 MG tablet Take by mouth. 08/06/19   [provider]  PREBIOTIC PRODUCT PO Take by mouth.    [provider]  Pregnenolone POWD by Does not apply route.    [provider]  PREMARIN vaginal cream Place  vaginally. 04/01/19   [provider]  thyroid (ARMOUR) 15 MG tablet Take one (1) 15 mg + one (1) 30 mg tablet for (45 mg Total) QOD  Alternating with (30 mg Total) QOD 08/06/19   [provider]    Family History Family History  Problem Relation Age of Onset   COPD Mother    Heart failure Mother    Diabetes Father    Hypertension Father    Heart failure Father     Social History Social History   Tobacco Use   Smoking status: Former    Types: Cigarettes    Quit date: 01/11/2006    Years since quitting: 15.5   Smokeless tobacco: Never  Vaping Use   Vaping Use: Never used  Substance Use Topics   Alcohol use: Never    Comment: 1 WINE/ DAILY   Drug use: No     Allergies   Latex, Nitrofurantoin macrocrystal, Sulfamethoxazole, Tape, Ibuprofen, and Penicillins   Review of Systems Review of Systems See HPI  Physical Exam Triage Vital Signs ED Triage Vitals  Enc Vitals Group     BP 07/12/21 1057 136/82     Pulse Rate 07/12/21 1057 93     Resp 07/12/21 1057 16     Temp 07/12/21 1057 99.1 F (37.3 C)     Temp Source 07/12/21 1057 Oral     SpO2 07/12/21 1057 96 %     Weight --      Height --      Head Circumference --      Peak Flow --      Pain Score 07/12/21 1101 2     Pain Loc --      Pain Edu? --      Excl. in GC? --    No data found.  Updated Vital Signs BP 136/82 (BP Location: Left Arm)   Pulse 93   Temp 99.1 F (37.3 C) (Oral)   Resp 16   SpO2 96%          Physical Exam Constitutional:      General: She is not in acute distress.    Appearance: She is well-developed.  HENT:     Head: Normocephalic and atraumatic.  Eyes:     Conjunctiva/sclera: Conjunctivae normal.     Pupils: Pupils are equal, round, and reactive to light.  Cardiovascular:     Rate and Rhythm: Normal rate.  Pulmonary:     Effort: Pulmonary effort is normal. No respiratory distress.  Abdominal:     General: There is no distension.     Palpations: Abdomen  is soft.     Tenderness: There is no abdominal tenderness. There is no right CVA tenderness or left CVA tenderness.  Musculoskeletal:        General: Normal range of motion.  Cervical back: Normal range of motion.  Skin:    General: Skin is warm and dry.  Neurological:     Mental Status: She is alert.  Psychiatric:        Mood and Affect: Mood normal.        Behavior: Behavior normal.      UC Treatments / Results  Labs (all labs ordered are listed, but only abnormal results are displayed) Labs Reviewed  POCT URINALYSIS DIP (MANUAL ENTRY) - Abnormal; Notable for the following components:      Result Value   Blood, UA trace-intact (*)    All other components within normal limits  URINE CULTURE    EKG   Radiology No results found.  Procedures Procedures (including critical care time)  Medications Ordered in UC Medications - No data to display  Initial Impression / Assessment and Plan / UC Course  I have reviewed the triage vital signs and the nursing notes.  Pertinent labs & imaging results that were available during my care of the patient were reviewed by me and considered in my medical decision making (see chart for details).     Urinalysis has minimal findings however patient does have symptoms suggestive of urinary tract infection.  We will culture the urine and cover her with antibiotics in the meantime. Final Clinical Impressions(s) / UC Diagnoses   Final diagnoses:  Cystitis  Recurrent UTI (urinary tract infection)     Discharge Instructions      I am prescribing a 10-day supply of cephalexin.  This is a good antibiotic for urinary tract infections.  I think trying different antibiotic than Cipro is appropriate  Your urine has been sent for culture.  The culture result will be available in 2 to 3 days.  You can see the culture on MyChart.  You will be called if any change in antibiotic is indicated  Follow-up with your usual primary care  provider   ED Prescriptions     Medication Sig Dispense Auth. Provider   cephALEXin (KEFLEX) 500 MG capsule Take 1 capsule (500 mg total) by mouth 2 (two) times daily. 20 capsule Eustace Moore, MD      PDMP not reviewed this encounter.   Eustace Moore, MD 07/12/21 [phone]

## 2021-07-12 NOTE — ED Triage Notes (Signed)
Patient c/o dysuria, frequency, bladder and flank pain x 1 week. H/o frequent UTIs. Her urologist recently retired.

## 2021-07-12 NOTE — Discharge Instructions (Signed)
I am prescribing a 10-day supply of cephalexin.  This is a good antibiotic for urinary tract infections.  I think trying different antibiotic than Cipro is appropriate  Your urine has been sent for culture.  The culture result will be available in 2 to 3 days.  You can see the culture on MyChart.  You will be called if any change in antibiotic is indicated  Follow-up with your usual primary care provider

## 2021-07-13 LAB — URINE CULTURE
MICRO NUMBER:: 13547911
SPECIMEN QUALITY:: ADEQUATE

## 2021-10-17 ENCOUNTER — Ambulatory Visit
Admission: EM | Admit: 2021-10-17 | Discharge: 2021-10-17 | Disposition: A | Discharge status: Home / Self Care | Payer: Medicare Other | Attending: Family Medicine | Admitting: Family Medicine

## 2021-10-17 ENCOUNTER — Encounter: Payer: Self-pay | Admitting: Emergency Medicine

## 2021-10-17 DIAGNOSIS — Z87440 Personal history of urinary (tract) infections: Secondary | ICD-10-CM | POA: Diagnosis not present

## 2021-10-17 DIAGNOSIS — N390 Urinary tract infection, site not specified: Secondary | ICD-10-CM | POA: Diagnosis not present

## 2021-10-17 DIAGNOSIS — R350 Frequency of micturition: Secondary | ICD-10-CM | POA: Diagnosis present

## 2021-10-17 DIAGNOSIS — I10 Essential (primary) hypertension: Secondary | ICD-10-CM | POA: Insufficient documentation

## 2021-10-17 DIAGNOSIS — Z7901 Long term (current) use of anticoagulants: Secondary | ICD-10-CM | POA: Diagnosis not present

## 2021-10-17 DIAGNOSIS — K219 Gastro-esophageal reflux disease without esophagitis: Secondary | ICD-10-CM | POA: Diagnosis not present

## 2021-10-17 DIAGNOSIS — Z79899 Other long term (current) drug therapy: Secondary | ICD-10-CM | POA: Diagnosis not present

## 2021-10-17 DIAGNOSIS — R300 Dysuria: Secondary | ICD-10-CM

## 2021-10-17 LAB — POCT URINALYSIS DIP (MANUAL ENTRY)
Bilirubin, UA: NEGATIVE
Blood, UA: NEGATIVE
Glucose, UA: NEGATIVE mg/dL
Ketones, POC UA: NEGATIVE mg/dL
Nitrite, UA: NEGATIVE
Protein Ur, POC: NEGATIVE mg/dL
Spec Grav, UA: 1.015 (ref 1.010–1.025)
Urobilinogen, UA: 0.2 E.U./dL
pH, UA: 6.5 (ref 5.0–8.0)

## 2021-10-17 MED ORDER — CIPROFLOXACIN HCL 500 MG PO TABS: 500.0000 mg | ORAL_TABLET | Freq: Two times a day (BID) | ORAL | 0 refills | Status: DC

## 2021-10-17 NOTE — ED Provider Notes (Signed)
[facility]    CSN: [id number] Arrival date & time: 10/17/21  1013      History   Chief Complaint Chief Complaint  Patient presents with   Urinary Frequency    HPI Mikayla French is a 66 y.o. female.   HPI  Patient states that she has recurring UTI.  She states that they are usually difficult to treat and she often has to have 2 weeks of antibiotics.  Patient has had some dysuria and frequency for the last 2 weeks that is getting worse.  Some low back achiness.  Some lower abdominal pressure.  No fever or chills.  No flank pain.  No nausea or vomiting.  No history of kidney stones or kidney infection  Past Medical History:  Diagnosis Date   Arthritis    Back pain occasional   post decompression w/ chiropractor --  w/ good improvement   Bunion of right foot    GERD (gastroesophageal reflux disease) occ. reflux   watches diet/  takes pickle juice   Hypertension     Patient Active Problem List   Diagnosis Date Noted   Acute deep vein thrombosis (DVT) (HCC) 11/21/2019   A-fib (HCC) 11/21/2019   Oral candidiasis 11/19/2019   Prediabetes 08/29/2019   Former smoker 08/29/2019   Peripheral neuropathy 10/11/2018   Age-related nuclear cataract of both eyes 03/19/2018   Dermatochalasis of both upper eyelids 03/19/2018   Blepharitis of upper and lower eyelids of both eyes 03/19/2018   Dry eye syndrome of both lacrimal glands 03/19/2018   Hypertensive retinopathy of both eyes 03/19/2018   Meibomian gland dysfunction (MGD) of both eyes 03/19/2018   Allergic rhinitis 01/25/2018   Bulging discs 01/25/2018   Endometriosis 01/25/2018   Acquired hypothyroidism 04/21/2017   Complex tear of medial meniscus of right knee as current injury 02/07/2016   Recurrent UTI 03/28/2011   Hematuria of undiagnosed cause 01/31/2011    Past Surgical History:  Procedure Laterality Date   BUNIONECTOMY  10-04-2010   LEFT FOOT   BUNIONECTOMY  01/18/2011   Procedure: Arbutus Leas;   Surgeon: Illene Labrador [place]pling[place];  Location: [facility];  Service: Orthopedics;  Laterality: Right;   EXCISION MORTON'S NEUROMA  10-04-2010   LEFT --  THIRD- [facility]   REMOVAL BAKER'S CYST RIGHT KNEE  6 YRS AGO    OB History   No obstetric history on file.      Home Medications    Prior to Admission medications   Medication Sig Start Date End Date Taking? Authorizing Provider  ACIDOPHILUS LACTOBACILLUS PO Take by mouth.   Yes [provider]  APIXABAN Everlene Balls) VTE STARTER PACK (10MG  AND 5MG ) Take as directed on package: start with two-5mg  tablets twice daily for 7 days. On day 8, switch to one-5mg  tablet twice daily. 11/14/19  Yes Phelps, Erin O, PA-C  atorvastatin (LIPITOR) 20 MG tablet Take 1 tablet (20 mg total) by mouth daily. 09/22/19  Yes 11/16/19, DO  b complex vitamins capsule Take by mouth.   Yes [provider]  Cholecalciferol 1.25 MG (50000 UT) TABS Take by mouth.   Yes [provider]  ciprofloxacin (CIPRO) 500 MG tablet Take 1 tablet (500 mg total) by mouth 2 (two) times daily. 10/17/21  Yes Sunnie Nielsen, MD  clobetasol cream (TEMOVATE) 0.05 % Use as directed bid 11/20/16  Yes [provider]  conjugated estrogens (PREMARIN) vaginal cream USE ONE-HALF (1/2) GRAM VAGINALLY 2 TO 3 NIGHTS A WEEK 08/18/16  Yes [provider]  metoprolol tartrate (LOPRESSOR) 50 MG tablet Take 50 mg by mouth 2 (two) times daily.   Yes [provider]  Multiple Vitamins-Minerals (MULTI-VITAMIN MENOPAUSAL) TABS Take 2 tablets by mouth once.     Yes [provider]  NP THYROID 30 MG tablet Take 30 mg by mouth daily. 08/06/19  Yes [provider]  Nutritional Supplements (MELATONIN PO) Take by mouth as needed.     Yes [provider]  phentermine (ADIPEX-P) 37.5 MG tablet Take by mouth. 08/06/19  Yes [provider]  PREBIOTIC PRODUCT PO Take by mouth.   Yes [provider]  Pregnenolone POWD by Does not apply route.   Yes [provider]  PREMARIN vaginal cream Place vaginally. 04/01/19  Yes [provider]  thyroid (ARMOUR) 15 MG tablet Take one (1) 15 mg + one (1) 30 mg tablet for (45 mg Total) QOD  Alternating with (30 mg Total) QOD 08/06/19  Yes [provider]    Family History Family History  Problem Relation Age of Onset   COPD Mother    Heart failure Mother    Diabetes Father    Hypertension Father    Heart failure Father     Social History Social History   Tobacco Use   Smoking status: Former    Types: Cigarettes    Quit date: 01/11/2006    Years since quitting: 15.7   Smokeless tobacco: Never  Vaping Use   Vaping Use: Never used  Substance Use Topics   Alcohol use: Never    Comment: 1 WINE/ DAILY   Drug use: No     Allergies   Latex, Nitrofurantoin macrocrystal, Sulfamethoxazole, Tape, Ibuprofen, and Penicillins   Review of Systems Review of Systems See HPI  Physical Exam Triage Vital Signs ED Triage Vitals  Enc Vitals Group     BP 10/17/21 1024 127/83     Pulse Rate 10/17/21 1024 88     Resp 10/17/21 1024 18     Temp 10/17/21 1024 97.7 F (36.5 C)     Temp Source 10/17/21 1024 Oral     SpO2 10/17/21 1024 93 %     Weight 10/17/21 1026 186 lb 15.2 oz (84.8 kg)     Height 10/17/21 1026 5\' 5"  (1.651 m)     Head Circumference --      Peak Flow --      Pain Score 10/17/21 1026 3     Pain Loc --      Pain Edu? --      Excl. in GC? --    No data found.  Updated Vital Signs BP 127/83 (BP Location: Right Arm)   Pulse 88   Temp 97.7 F (36.5 C) (Oral)   Resp 18   Ht 5\' 5"  (1.651 m)   Wt 84.8 kg   SpO2 93%   BMI 31.11 kg/m      Physical Exam Constitutional:      General: She is not in acute distress.    Appearance: She is well-developed.  HENT:     Head: Normocephalic and atraumatic.  Eyes:     Conjunctiva/sclera: Conjunctivae normal.     Pupils: Pupils are equal, round, and  reactive to light.  Cardiovascular:     Rate and Rhythm: Normal rate.  Pulmonary:     Effort: Pulmonary effort is normal. No respiratory distress.  Abdominal:     General: There is no distension.     Palpations:  Abdomen is soft.     Tenderness: There is no abdominal tenderness. There is no right CVA tenderness or left CVA tenderness.  Musculoskeletal:        General: Normal range of motion.     Cervical back: Normal range of motion.  Skin:    General: Skin is warm and dry.  Neurological:     Mental Status: She is alert.  Psychiatric:        Mood and Affect: Mood normal.        Behavior: Behavior normal.      UC Treatments / Results  Labs (all labs ordered are listed, but only abnormal results are displayed) Labs Reviewed  POCT URINALYSIS DIP (MANUAL ENTRY) - Abnormal; Notable for the following components:      Result Value   Leukocytes, UA Trace (*)    All other components within normal limits  URINE CULTURE    EKG   Radiology No results found.  Procedures Procedures (including critical care time)  Medications Ordered in UC Medications - No data to display  Initial Impression / Assessment and Plan / UC Course  I have reviewed the triage vital signs and the nursing notes.  Pertinent labs & imaging results that were available during my care of the patient were reviewed by me and considered in my medical decision making (see chart for details).     I have concern for 2 weeks of Cipro given patient's UTI earlier this year that ended up being no growth.  She is advised to check and make sure that she has bacterial growth before she completes the antibiotic course.  Follow-up with PCP Final Clinical Impressions(s) / UC Diagnoses   Final diagnoses:  Recurrent UTI  Dysuria     Discharge Instructions      Continue to drink lots of water Take Cipro 2 times a day Check MyChart for your culture report.  It should be available in 3 days. If your culture is  negative, you may stop the antibiotics when you feel better.  If your culture is positive for infection take the whole 2 weeks.  Follow-up with your primary care   ED Prescriptions     Medication Sig Dispense Auth. Provider   ciprofloxacin (CIPRO) 500 MG tablet Take 1 tablet (500 mg total) by mouth 2 (two) times daily. 28 tablet Raylene Everts, MD      PDMP not reviewed this encounter.   Raylene Everts, MD 10/17/21 1051

## 2021-10-17 NOTE — ED Triage Notes (Signed)
Patient c/o urinary frequency, dysuria, some low back pain x 1 week.  Patient does have incontinence and no hematuria.

## 2021-10-17 NOTE — Discharge Instructions (Signed)
Continue to drink lots of water Take Cipro 2 times a day Check MyChart for your culture report.  It should be available in 3 days. If your culture is negative, you may stop the antibiotics when you feel better.  If your culture is positive for infection take the whole 2 weeks.  Follow-up with your primary care

## 2021-10-18 LAB — URINE CULTURE: Culture: 30000 — AB

## 2022-01-10 ENCOUNTER — Ambulatory Visit
Admission: EM | Admit: 2022-01-10 | Discharge: 2022-01-10 | Disposition: A | Discharge status: Home / Self Care | Payer: Medicare Other | Attending: Family Medicine | Admitting: Family Medicine

## 2022-01-10 DIAGNOSIS — R0982 Postnasal drip: Secondary | ICD-10-CM

## 2022-01-10 MED ORDER — CETIRIZINE-PSEUDOEPHEDRINE ER 5-120 MG PO TB12: 1.0000 | ORAL_TABLET | Freq: Two times a day (BID) | ORAL | 0 refills | Status: AC

## 2022-01-10 NOTE — Discharge Instructions (Signed)
Take Zyrtec d once or twice a day every day for the next week Follow-up with ENT if you fail to improve in a week or 2

## 2022-01-10 NOTE — ED Triage Notes (Signed)
Pt c/o intermittent throat issues x 4 days. Says she was eating one day and when she swallowed, felt a burning sensation. When she looked in the back of her throat, she noticed white on in the back of her throat, not her tonsils. Denies fever, cough, congestion.

## 2022-01-10 NOTE — ED Provider Notes (Signed)
[facility]    CSN: [id number] Arrival date & time: 01/10/22  1209      History   Chief Complaint Chief Complaint  Patient presents with   Sore Throat    HPI Mikayla French is a 66 y.o. female.   HPI  Patient states she has an irritated feeling in the back of her throat.  Is been present off-and-on for 4 days.  She looked in the back of her throat and saw some white areas.  She would like these identified.  She has had some problems with postnasal drip in the past and did well with Zyrtec-D.  She has not taken any Zyrtec recently.  She is not having any real sinus congestion or pain, purulent drainage, fever, or painful sore throat.  She just has the white areas that concern her  Past Medical History:  Diagnosis Date   Arthritis    Back pain occasional   post decompression w/ chiropractor --  w/ good improvement   Bunion of right foot    GERD (gastroesophageal reflux disease) occ. reflux   watches diet/  takes pickle juice   Hypertension     Patient Active Problem List   Diagnosis Date Noted   Acute deep vein thrombosis (DVT) (HCC) 11/21/2019   A-fib (HCC) 11/21/2019   Oral candidiasis 11/19/2019   Prediabetes 08/29/2019   Former smoker 08/29/2019   Peripheral neuropathy 10/11/2018   Age-related nuclear cataract of both eyes 03/19/2018   Dermatochalasis of both upper eyelids 03/19/2018   Blepharitis of upper and lower eyelids of both eyes 03/19/2018   Dry eye syndrome of both lacrimal glands 03/19/2018   Hypertensive retinopathy of both eyes 03/19/2018   Meibomian gland dysfunction (MGD) of both eyes 03/19/2018   Allergic rhinitis 01/25/2018   Bulging discs 01/25/2018   Endometriosis 01/25/2018   Acquired hypothyroidism 04/21/2017   Complex tear of medial meniscus of right knee as current injury 02/07/2016   Recurrent UTI 03/28/2011   Hematuria of undiagnosed cause 01/31/2011    Past Surgical History:  Procedure Laterality Date   BUNIONECTOMY   10-04-2010   LEFT FOOT   BUNIONECTOMY  01/18/2011   Procedure: Arbutus Leas;  Surgeon: Illene Labrador [place];  Location: [facility];  Service: Orthopedics;  Laterality: Right;   EXCISION MORTON'S NEUROMA  10-04-2010   LEFT --  THIRD- [facility]   REMOVAL BAKER'S CYST RIGHT KNEE  6 YRS AGO    OB History   No obstetric history on file.      Home Medications    Prior to Admission medications   Medication Sig Start Date End Date Taking? Authorizing Provider  cetirizine-pseudoephedrine (ZYRTEC-D) 5-120 MG tablet Take 1 tablet by mouth 2 (two) times daily. 01/10/22  Yes Eustace Moore, MD  ACIDOPHILUS LACTOBACILLUS PO Take by mouth.    [provider]  APIXABAN Everlene Balls) VTE STARTER PACK (10MG  AND 5MG ) Take as directed on package: start with two-5mg  tablets twice daily for 7 days. On day 8, switch to one-5mg  tablet twice daily. 11/14/19   , PA-C  atorvastatin (LIPITOR) 20 MG tablet Take 1 tablet (20 mg total) by mouth daily. 09/22/19   Lurene Shadow, DO  b complex vitamins capsule Take by mouth.    [provider]  Cholecalciferol 1.25 MG (50000 UT) TABS Take by mouth.    [provider]  clobetasol cream (TEMOVATE) 0.05 % Use as directed bid 11/20/16   [provider]  metoprolol tartrate (LOPRESSOR) 50  MG tablet Take 50 mg by mouth 2 (two) times daily.    [provider]  Multiple Vitamins-Minerals (MULTI-VITAMIN MENOPAUSAL) TABS Take 2 tablets by mouth once.      [provider]  NP THYROID 30 MG tablet Take 30 mg by mouth daily. 08/06/19   [provider]  Nutritional Supplements (MELATONIN PO) Take by mouth as needed.      [provider]  PREBIOTIC PRODUCT PO Take by mouth.    [provider]  Pregnenolone POWD by Does not apply route.    [provider]  PREMARIN vaginal cream Place vaginally. 04/01/19   [provider]    Family History Family  History  Problem Relation Age of Onset   COPD Mother    Heart failure Mother    Diabetes Father    Hypertension Father    Heart failure Father     Social History Social History   Tobacco Use   Smoking status: Former    Types: Cigarettes    Quit date: 01/11/2006    Years since quitting: 16.0   Smokeless tobacco: Never  Vaping Use   Vaping Use: Never used  Substance Use Topics   Alcohol use: Never    Comment: 1 WINE/ DAILY   Drug use: No     Allergies   Latex, Nitrofurantoin macrocrystal, Sulfamethoxazole, Tape, Ibuprofen, and Penicillins   Review of Systems Review of Systems See HPI  Physical Exam Triage Vital Signs ED Triage Vitals  Enc Vitals Group     BP 01/10/22 1306 121/81     Pulse Rate 01/10/22 1306 81     Resp 01/10/22 1306 18     Temp 01/10/22 1306 98.1 F (36.7 C)     Temp Source 01/10/22 1306 Oral     SpO2 01/10/22 1306 97 %     Weight --      Height --      Head Circumference --      Peak Flow --      Pain Score 01/10/22 1307 0     Pain Loc --      Pain Edu? --      Excl. in GC? --    No data found.  Updated Vital Signs BP 121/81 (BP Location: Right Arm)   Pulse 81   Temp 98.1 F (36.7 C) (Oral)   Resp 18   SpO2 97%        Physical Exam Constitutional:      General: She is not in acute distress.    Appearance: She is well-developed and normal weight.  HENT:     Head: Normocephalic and atraumatic.     Right Ear: Tympanic membrane and ear canal normal.     Left Ear: Tympanic membrane and ear canal normal.     Nose: No congestion or rhinorrhea.     Mouth/Throat:   Eyes:     Conjunctiva/sclera: Conjunctivae normal.     Pupils: Pupils are equal, round, and reactive to light.  Cardiovascular:     Rate and Rhythm: Normal rate.  Pulmonary:     Effort: Pulmonary effort is normal. No respiratory distress.  Abdominal:     General: There is no distension.     Palpations: Abdomen is soft.  Musculoskeletal:        General: Normal  range of motion.     Cervical back: Normal range of motion.  Skin:    General: Skin is warm and dry.  Neurological:  Mental Status: She is alert.      UC Treatments / Results  Labs (all labs ordered are listed, but only abnormal results are displayed) Labs Reviewed - No data to display  EKG   Radiology No results found.  Procedures Procedures (including critical care time)  Medications Ordered in UC Medications - No data to display  Initial Impression / Assessment and Plan / UC Course  I have reviewed the triage vital signs and the nursing notes.  Pertinent labs & imaging results that were available during my care of the patient were reviewed by me and considered in my medical decision making (see chart for details).     I see the areas that patient is concerned with but do not see any lesion or abnormality that requires treatment.  Will treat the PND with Zyrtec-D.  Follow-up with her own doctor or ENT for concerns Final Clinical Impressions(s) / UC Diagnoses   Final diagnoses:  Post-nasal drainage     Discharge Instructions      Take Zyrtec d once or twice a day every day for the next week Follow-up with ENT if you fail to improve in a week or 2   ED Prescriptions     Medication Sig Dispense Auth. Provider   cetirizine-pseudoephedrine (ZYRTEC-D) 5-120 MG tablet Take 1 tablet by mouth 2 (two) times daily. 30 tablet Eustace Moore, MD      PDMP not reviewed this encounter.   Eustace Moore, MD 01/10/22 [phone]

## 2022-04-06 ENCOUNTER — Ambulatory Visit
Admission: EM | Admit: 2022-04-06 | Discharge: 2022-04-06 | Disposition: A | Discharge status: Home / Self Care | Payer: Medicare Other | Attending: Urgent Care | Admitting: Urgent Care

## 2022-04-06 DIAGNOSIS — H5789 Other specified disorders of eye and adnexa: Secondary | ICD-10-CM

## 2022-04-06 DIAGNOSIS — J302 Other seasonal allergic rhinitis: Secondary | ICD-10-CM | POA: Diagnosis not present

## 2022-04-06 HISTORY — DX: Acute embolism and thrombosis of unspecified deep veins of unspecified lower extremity: I82.409

## 2022-04-06 MED ORDER — PAZEO 0.7 % OP SOLN: 1.0000 [drp] | Freq: Every day | OPHTHALMIC | 0 refills | Status: AC

## 2022-04-06 NOTE — ED Triage Notes (Addendum)
Pt presents to Urgent Care with c/o L eye irritation and itching since last night. States she feels as if she has something in her eye. She has flushed w/ saline. Denies visual disturbance/changes.

## 2022-04-06 NOTE — ED Provider Notes (Signed)
Mikayla French CARE    CSN: [id number] Arrival date & time: 04/06/22  1221      History   Chief Complaint Chief Complaint  Patient presents with   Eye Problem    HPI Mikayla French is a 67 y.o. female.   67yo female with known hx of seasonal allergies presents today with c/o eye irritation and itching to the L lateral corner of her eye for the past 24 hours.  She denies any known foreign body exposure.  She does not wear contact lenses.  She takes Zyrtec-D on a daily basis for her chronic rhinitis and middle ear fluid.  She states her allergy symptoms are not worse than normal.  She denies any new URI symptoms.  No fever.  She denies photophobia, headache, fever, change in vision.  No swelling of the eyelid, pain in the eyes, or change in movements. She tried saline flushes to her L eye yesterday evening without improvement.   Eye Problem   Past Medical History:  Diagnosis Date   Arthritis    Back pain occasional   post decompression w/ chiropractor --  w/ good improvement   Bunion of right foot    DVT (deep venous thrombosis) (HCC)    GERD (gastroesophageal reflux disease) occ. reflux   watches diet/  takes pickle juice   Hypertension     Patient Active Problem List   Diagnosis Date Noted   Acute deep vein thrombosis (DVT) ([facility]) 11/21/2019   A-fib ([place]) 11/21/2019   Oral candidiasis 11/19/2019   Prediabetes 08/29/2019   Former smoker 08/29/2019   Peripheral neuropathy 10/11/2018   Age-related nuclear cataract of both eyes 03/19/2018   Dermatochalasis of both upper eyelids 03/19/2018   Blepharitis of upper and lower eyelids of both eyes 03/19/2018   Dry eye syndrome of both lacrimal glands 03/19/2018   Hypertensive retinopathy of both eyes 03/19/2018   Meibomian gland dysfunction (MGD) of both eyes 03/19/2018   Allergic rhinitis 01/25/2018   Bulging discs 01/25/2018   Endometriosis 01/25/2018   Acquired hypothyroidism 04/21/2017   Complex tear of medial  meniscus of right knee as current injury 02/07/2016   Recurrent UTI 03/28/2011   Hematuria of undiagnosed cause 01/31/2011    Past Surgical History:  Procedure Laterality Date   BUNIONECTOMY  10-04-2010   LEFT FOOT   BUNIONECTOMY  01/18/2011   Procedure: Lillard Anes;  Surgeon: Laurice Record Aplington;  Location: [facility];  Service: Orthopedics;  Laterality: Right;   EXCISION MORTON'S NEUROMA  10-04-2010   LEFT --  THIRD- [facility]   REMOVAL BAKER'S CYST RIGHT KNEE  6 YRS AGO    OB History   No obstetric history on file.      Home Medications    Prior to Admission medications   Medication Sig Start Date End Date Taking? Authorizing Provider  Olopatadine HCl (PAZEO) 0.7 % SOLN Apply 1 drop to eye daily. 04/06/22  Yes Eyan Hagood L, PA  ACIDOPHILUS LACTOBACILLUS PO Take by mouth.    [provider]  APIXABAN Arne Cleveland) VTE STARTER PACK ('10MG'$  AND '5MG'$ ) Take as directed on package: start with two-'5mg'$  tablets twice daily for 7 days. On day 8, switch to one-'5mg'$  tablet twice daily. 11/14/19   Noe Gens, PA-C  atorvastatin (LIPITOR) 20 MG tablet Take 1 tablet (20 mg total) by mouth daily. 09/22/19   Emeterio Reeve, DO  b complex vitamins capsule Take by mouth.    [provider]  cetirizine-pseudoephedrine (ZYRTEC-D) 5-120 MG tablet  Take 1 tablet by mouth 2 (two) times daily. 01/10/22   Raylene Everts, MD  Cholecalciferol 1.25 MG (50000 UT) TABS Take by mouth.    [provider]  clobetasol cream (TEMOVATE) 0.05 % Use as directed bid 11/20/16   [provider]  metoprolol tartrate (LOPRESSOR) 50 MG tablet Take 50 mg by mouth 2 (two) times daily.    [provider]  Multiple Vitamins-Minerals (MULTI-VITAMIN MENOPAUSAL) TABS Take 2 tablets by mouth once.      [provider]  NP THYROID 30 MG tablet Take 30 mg by mouth daily. 08/06/19   [provider]  Nutritional Supplements (MELATONIN PO) Take by  mouth as needed.      [provider]  PREBIOTIC PRODUCT PO Take by mouth.    [provider]  Pregnenolone POWD by Does not apply route.    [provider]  PREMARIN vaginal cream Place vaginally. 04/01/19   [provider]    Family History Family History  Problem Relation Age of Onset   COPD Mother    Heart failure Mother    Diabetes Father    Hypertension Father    Heart failure Father     Social History Social History   Tobacco Use   Smoking status: Former    Types: Cigarettes    Quit date: 01/11/2006    Years since quitting: 16.2   Smokeless tobacco: Never  Vaping Use   Vaping Use: Never used  Substance Use Topics   Alcohol use: Yes    Comment: occ   Drug use: No     Allergies   Latex, Nitrofurantoin macrocrystal, Sulfamethoxazole, Tape, Ibuprofen, and Penicillins   Review of Systems Review of Systems As per HPI  Physical Exam Triage Vital Signs ED Triage Vitals  Enc Vitals Group     BP 04/06/22 1242 113/74     Pulse Rate 04/06/22 1242 84     Resp 04/06/22 1242 20     Temp 04/06/22 1242 97.7 F (36.5 C)     Temp Source 04/06/22 1242 Oral     SpO2 04/06/22 1242 94 %     Weight 04/06/22 1238 164 lb (74.4 kg)     Height 04/06/22 1238 '5\' 5"'$  (1.651 m)     Head Circumference --      Peak Flow --      Pain Score 04/06/22 1238 0     Pain Loc --      Pain Edu? --      Excl. in [place]? --    No data found.  Updated Vital Signs BP 113/74 (BP Location: Right Arm)   Pulse 84   Temp 97.7 F (36.5 C) (Oral)   Resp 20   Ht '5\' 5"'$  (1.651 m)   Wt 164 lb (74.4 kg)   SpO2 94%   BMI 27.29 kg/m   Visual Acuity Right Eye Distance:   Left Eye Distance:   Bilateral Distance:    Right Eye Near:   Left Eye Near:    Bilateral Near:     Physical Exam Constitutional:      Appearance: Normal appearance. She is obese.  HENT:     Head: Normocephalic and atraumatic.     Right Ear: Tympanic membrane, ear canal and external ear  normal. There is no impacted cerumen.     Left Ear: Tympanic membrane, ear canal and external ear normal. There is no impacted cerumen.     Nose: Congestion and rhinorrhea present.  Mouth/Throat:     Mouth: Mucous membranes are moist.     Pharynx: Oropharynx is clear. No oropharyngeal exudate or posterior oropharyngeal erythema.  Eyes:     General: Lids are normal. Lids are everted, no foreign bodies appreciated. Vision grossly intact. Gaze aligned appropriately. No allergic shiner, visual field deficit or scleral icterus.       Right eye: No foreign body, discharge or hordeolum.        Left eye: No foreign body, discharge or hordeolum.     Extraocular Movements: Extraocular movements intact.     Right eye: Normal extraocular motion and no nystagmus.     Left eye: Normal extraocular motion and no nystagmus.     Conjunctiva/sclera: Conjunctivae normal.     Right eye: Right conjunctiva is not injected. No chemosis, exudate or hemorrhage.    Left eye: Left conjunctiva is not injected. No chemosis, exudate or hemorrhage.    Pupils: Pupils are equal, round, and reactive to light. Pupils are equal.     Right eye: Pupil is round and reactive.     Left eye: Pupil is round, reactive and not sluggish. No corneal abrasion or fluorescein uptake.  Neurological:     Mental Status: She is alert.      UC Treatments / Results  Labs (all labs ordered are listed, but only abnormal results are displayed) Labs Reviewed - No data to display  EKG   Radiology No results found.  Procedures Procedures (including critical care time)  Medications Ordered in UC Medications - No data to display  Initial Impression / Assessment and Plan / UC Course  I have reviewed the triage vital signs and the nursing notes.  Pertinent labs & imaging results that were available during my care of the patient were reviewed by me and considered in my medical decision making (see chart for details).     Seasonal  allergies -patient already on Zyrtec-D.  I did recommend possibly switching to Xyzal.  Additionally discussed nasal sprays.  Patient refuses nasal spray medication due to adverse reactions. Left eye irritation -no visible abnormalities noted on exam.  Given her known seasonal allergies and chief complaint of itching, I suspect this to be developing allergic conjunctivitis.  Will start P[place]o one drop daily PRN.  Return to clinic precautions were discussed.   Final Clinical Impressions(s) / UC Diagnoses   Final diagnoses:  Seasonal allergies  Eye irritation     Discharge Instructions      I do not see a foreign body.  Additionally, you do not have an ulcer or an abrasion. I suspect you are developing allergic conjunctivitis. Use the prescribed drops once daily into the affected eye on an as-needed basis.  Please read the attached handout regarding allergic conjunctivitis. If you develop any change in vision, green discharge, pain in the eye, or fever, please return to clinic for recheck     ED Prescriptions     Medication Sig Dispense Auth. Provider   Olopatadine HCl (PAZEO) 0.7 % SOLN Apply 1 drop to eye daily. 2.5 mL Ersilia Brawley L, PA      PDMP not reviewed this encounter.   Chaney Malling, [place] 04/06/22 1307

## 2022-04-06 NOTE — Discharge Instructions (Signed)
I do not see a foreign body.  Additionally, you do not have an ulcer or an abrasion. I suspect you are developing allergic conjunctivitis. Use the prescribed drops once daily into the affected eye on an as-needed basis.  Please read the attached handout regarding allergic conjunctivitis. If you develop any change in vision, green discharge, pain in the eye, or fever, please return to clinic for recheck

## 2022-05-31 ENCOUNTER — Encounter: Payer: Self-pay | Admitting: Emergency Medicine

## 2022-05-31 ENCOUNTER — Ambulatory Visit
Admission: EM | Admit: 2022-05-31 | Discharge: 2022-05-31 | Disposition: A | Discharge status: Home / Self Care | Payer: Medicare Other | Attending: Family Medicine | Admitting: Family Medicine

## 2022-05-31 DIAGNOSIS — N3001 Acute cystitis with hematuria: Secondary | ICD-10-CM | POA: Diagnosis present

## 2022-05-31 LAB — POCT URINALYSIS DIP (MANUAL ENTRY)
Bilirubin, UA: NEGATIVE
Glucose, UA: NEGATIVE mg/dL
Ketones, POC UA: NEGATIVE mg/dL
Nitrite, UA: POSITIVE — AB
Protein Ur, POC: 30 mg/dL — AB
Spec Grav, UA: >=1.03 — AB (ref 1.010–1.025)
Urobilinogen, UA: 0.2 E.U./dL
pH, UA: 5.5 (ref 5.0–8.0)

## 2022-05-31 MED ORDER — CIPROFLOXACIN HCL 500 MG PO TABS: 500.0000 mg | ORAL_TABLET | Freq: Two times a day (BID) | ORAL | 0 refills | Status: AC

## 2022-05-31 NOTE — ED Provider Notes (Signed)
Mikayla French CARE    CSN: [id number] Arrival date & time: 05/31/22  0815      History   Chief Complaint Chief Complaint  Patient presents with   Dysuria    HPI Mikayla French is a 67 y.o. female.   HPI Pleasant 67 year old female presents with dysuria, urinary pressure and frequency for 10 days.  Denies hematuria.  PMH significant for recurrent UTI, endometriosis, and peripheral neuropathy.  Past Medical History:  Diagnosis Date   Arthritis    Back pain occasional   post decompression w/ chiropractor --  w/ good improvement   Bunion of right foot    DVT (deep venous thrombosis) (HCC)    GERD (gastroesophageal reflux disease) occ. reflux   watches diet/  takes pickle juice   Hypertension     Patient Active Problem List   Diagnosis Date Noted   Acute deep vein thrombosis (DVT) (HCC) 11/21/2019   A-fib (HCC) 11/21/2019   Oral candidiasis 11/19/2019   Prediabetes 08/29/2019   Former smoker 08/29/2019   Peripheral neuropathy 10/11/2018   Age-related nuclear cataract of both eyes 03/19/2018   Dermatochalasis of both upper eyelids 03/19/2018   Blepharitis of upper and lower eyelids of both eyes 03/19/2018   Dry eye syndrome of both lacrimal glands 03/19/2018   Hypertensive retinopathy of both eyes 03/19/2018   Meibomian gland dysfunction (MGD) of both eyes 03/19/2018   Allergic rhinitis 01/25/2018   Bulging discs 01/25/2018   Endometriosis 01/25/2018   Acquired hypothyroidism 04/21/2017   Complex tear of medial meniscus of right knee as current injury 02/07/2016   Recurrent UTI 03/28/2011   Hematuria of undiagnosed cause 01/31/2011    Past Surgical History:  Procedure Laterality Date   BUNIONECTOMY  10-04-2010   LEFT FOOT   BUNIONECTOMY  01/18/2011   Procedure: Arbutus Leas;  Surgeon: Illene Labrador Apling[place];  Location: [facility];  Service: Orthopedics;  Laterality: Right;   EXCISION MORTON'S NEUROMA  10-04-2010   LEFT --  THIRD- [facility]    REMOVAL BAKER'S CYST RIGHT KNEE  6 YRS AGO    OB History   No obstetric history on file.      Home Medications    Prior to Admission medications   Medication Sig Start Date End Date Taking? Authorizing Provider  ACIDOPHILUS LACTOBACILLUS PO Take by mouth.   Yes [provider]  APIXABAN Everlene Balls) VTE STARTER PACK (10MG  AND 5MG ) Take as directed on package: start with two-5mg  tablets twice daily for 7 days. On day 8, switch to one-5mg  tablet twice daily. 11/14/19  Yes Phelps, Erin O, PA-C  atorvastatin (LIPITOR) 20 MG tablet Take 1 tablet (20 mg total) by mouth daily. 09/22/19  Yes Sunnie Nielsen, DO  b complex vitamins capsule Take by mouth.   Yes [provider]  cetirizine-pseudoephedrine (ZYRTEC-D) 5-120 MG tablet Take 1 tablet by mouth 2 (two) times daily. 01/10/22  Yes Eustace Moore, MD  Cholecalciferol 1.25 MG (50000 UT) TABS Take by mouth.   Yes [provider]  ciprofloxacin (CIPRO) 500 MG tablet Take 1 tablet (500 mg total) by mouth 2 (two) times daily for 10 days. 05/31/22 06/10/22 Yes Trevor Iha, FNP  clobetasol cream (TEMOVATE) 0.05 % Use as directed bid 11/20/16  Yes [provider]  metoprolol tartrate (LOPRESSOR) 50 MG tablet Take 50 mg by mouth 2 (two) times daily.   Yes [provider]  Multiple Vitamins-Minerals (MULTI-VITAMIN MENOPAUSAL) TABS Take 2 tablets by mouth once.     Yes [provider]  NP THYROID 30 MG tablet Take 30 mg by mouth daily. 08/06/19  Yes [provider]  Nutritional Supplements (MELATONIN PO) Take by mouth as needed.     Yes [provider]  Olopatadine HCl (PAZEO) 0.7 % SOLN Apply 1 drop to eye daily. 04/06/22  Yes Crain, Whitney L, PA  PREBIOTIC PRODUCT PO Take by mouth.   Yes [provider]  Pregnenolone POWD by Does not apply route.   Yes [provider]  PREMARIN vaginal cream Place vaginally. 04/01/19  Yes [provider]    Family  History Family History  Problem Relation Age of Onset   COPD Mother    Heart failure Mother    Diabetes Father    Hypertension Father    Heart failure Father     Social History Social History   Tobacco Use   Smoking status: Former    Types: Cigarettes    Quit date: 01/11/2006    Years since quitting: 16.3   Smokeless tobacco: Never  Vaping Use   Vaping Use: Never used  Substance Use Topics   Alcohol use: Yes    Comment: occ   Drug use: No     Allergies   Latex, Nitrofurantoin macrocrystal, Sulfamethoxazole, Tape, Ibuprofen, and Penicillins   Review of Systems Review of Systems  Genitourinary:  Positive for dysuria.     Physical Exam Triage Vital Signs ED Triage Vitals  Enc Vitals Group     BP 05/31/22 0841 111/72     Pulse Rate 05/31/22 0841 73     Resp 05/31/22 0841 16     Temp 05/31/22 0841 97.6 F (36.4 C)     Temp Source 05/31/22 0841 Oral     SpO2 05/31/22 0841 96 %     Weight 05/31/22 0843 163 lb (73.9 kg)     Height 05/31/22 0843 5\' 5"  (1.651 m)     Head Circumference --      Peak Flow --      Pain Score 05/31/22 0842 5     Pain Loc --      Pain Edu? --      Excl. in [place]? --    No data found.  Updated Vital Signs BP 111/72 (BP Location: Right Arm)   Pulse 73   Temp 97.6 F (36.4 C) (Oral)   Resp 16   Ht 5\' 5"  (1.651 m)   Wt 163 lb (73.9 kg)   SpO2 96%   BMI 27.12 kg/m    Physical Exam Vitals and nursing note reviewed.  Constitutional:      Appearance: Normal appearance. She is obese.  HENT:     Head: Normocephalic and atraumatic.     Mouth/Throat:     Mouth: Mucous membranes are moist.     Pharynx: Oropharynx is clear.  Eyes:     Extraocular Movements: Extraocular movements intact.     Conjunctiva/sclera: Conjunctivae normal.     Pupils: Pupils are equal, round, and reactive to light.  Cardiovascular:     Rate and Rhythm: Normal rate and regular rhythm.     Pulses: Normal pulses.     Heart sounds: Normal heart sounds.   Pulmonary:     Effort: Pulmonary effort is normal.     Breath sounds: Normal breath sounds. No wheezing, rhonchi or rales.  Abdominal:     Tenderness: There is no right CVA tenderness or left CVA tenderness.  Musculoskeletal:        General: Normal  range of motion.     Cervical back: Normal range of motion and neck supple.  Skin:    General: Skin is warm and dry.  Neurological:     General: No focal deficit present.     Mental Status: She is alert and oriented to person, place, and time. Mental status is at baseline.      UC Treatments / Results  Labs (all labs ordered are listed, but only abnormal results are displayed) Labs Reviewed  POCT URINALYSIS DIP (MANUAL ENTRY) - Abnormal; Notable for the following components:      Result Value   Color, UA light yellow (*)    Spec Grav, UA >=1.030 (*)    Blood, UA trace-intact (*)    Protein Ur, POC =30 (*)    Nitrite, UA Positive (*)    Leukocytes, UA Small (1+) (*)    All other components within normal limits  URINE CULTURE    EKG   Radiology No results found.  Procedures Procedures (including critical care time)  Medications Ordered in UC Medications - No data to display  Initial Impression / Assessment and Plan / UC Course  I have reviewed the triage vital signs and the nursing notes.  Pertinent labs & imaging results that were available during my care of the patient were reviewed by me and considered in my medical decision making (see chart for details).     MDM: 1.  Acute cystitis with hematuria-Rx'd Cipro 500 mg x 10 days (patient requested 10 days rather than 7 as this is what she is accompanied to from her urologist who she currently cannot get in touch with). Instructed patient to take medication as directed with food to completion.  Advised patient to increase daily water intake to 64 ounces per day while taking this medication.  Advised we will follow-up with urine culture results once received.  Advised if  symptoms worsen and/or unresolved please follow-up with PCP or [facility] urology for further evaluation.  Patient discharged home, hemodynamically stable. Final Clinical Impressions(s) / UC Diagnoses   Final diagnoses:  Acute cystitis with hematuria     Discharge Instructions      Instructed patient to take medication as directed with food to completion.  Advised patient to increase daily water intake to 64 ounces per day while taking this medication.  Advised we will follow-up with urine culture results once received.  Advised if symptoms worsen and/or unresolved please follow-up with PCP or [facility] urology for further evaluation.     ED Prescriptions     Medication Sig Dispense Auth. Provider   ciprofloxacin (CIPRO) 500 MG tablet Take 1 tablet (500 mg total) by mouth 2 (two) times daily for 10 days. 20 tablet Trevor Iha, FNP      PDMP not reviewed this encounter.   Trevor Iha, FNP 05/31/22 [phone]

## 2022-05-31 NOTE — ED Triage Notes (Signed)
Patient c/o dysuria, pressure, frequency x 10 days.  Denies any hematuria.  Patient denies any OTC pain meds.

## 2022-05-31 NOTE — Discharge Instructions (Addendum)
Instructed patient to take medication as directed with food to completion.  Advised patient to increase daily water intake to 64 ounces per day while taking this medication.  Advised we will follow-up with urine culture results once received.  Advised if symptoms worsen and/or unresolved please follow-up with PCP or [facility] urology for further evaluation.

## 2022-06-01 LAB — URINE CULTURE

## 2022-06-18 ENCOUNTER — Ambulatory Visit
Admission: EM | Admit: 2022-06-18 | Discharge: 2022-06-18 | Disposition: A | Discharge status: Home / Self Care | Payer: Medicare Other | Attending: Internal Medicine | Admitting: Internal Medicine

## 2022-06-18 ENCOUNTER — Encounter: Payer: Self-pay | Admitting: Emergency Medicine

## 2022-06-18 DIAGNOSIS — R300 Dysuria: Secondary | ICD-10-CM | POA: Diagnosis not present

## 2022-06-18 LAB — POCT URINALYSIS DIP (MANUAL ENTRY)
Bilirubin, UA: NEGATIVE
Glucose, UA: NEGATIVE mg/dL
Ketones, POC UA: NEGATIVE mg/dL
Nitrite, UA: NEGATIVE
Protein Ur, POC: NEGATIVE mg/dL
Spec Grav, UA: 1.02 (ref 1.010–1.025)
Urobilinogen, UA: 0.2 E.U./dL
pH, UA: 5 (ref 5.0–8.0)

## 2022-06-18 NOTE — ED Provider Notes (Signed)
[facility]    CSN: [id number] Arrival date & time: 06/18/22  1244      History   Chief Complaint Chief Complaint  Patient presents with   Dysuria    HPI Kennetta Acomb is a 67 y.o. female with a history of recurrent urinary tract infections and lichen sclerosis comes to the urgent care with dysuria.  Patient is concerned that she may have a urinary tract infection.  Patient was seen in the urgent care couple of weeks ago for similar symptoms and was prescribed 10-day course of ciprofloxacin.  Patient completed the 10-day course of ciprofloxacin and is requesting extension of the antibiotics.  Patient denies flank pain, nausea, vomiting, fever, chills.  No abdominal pain or distention.   HPI  Past Medical History:  Diagnosis Date   Arthritis    Back pain occasional   post decompression w/ chiropractor --  w/ good improvement   Bunion of right foot    DVT (deep venous thrombosis) (HCC)    GERD (gastroesophageal reflux disease) occ. reflux   watches diet/  takes pickle juice   Hypertension     Patient Active Problem List   Diagnosis Date Noted   Acute deep vein thrombosis (DVT) (HCC) 11/21/2019   A-fib (HCC) 11/21/2019   Oral candidiasis 11/19/2019   Prediabetes 08/29/2019   Former smoker 08/29/2019   Peripheral neuropathy 10/11/2018   Age-related nuclear cataract of both eyes 03/19/2018   Dermatochalasis of both upper eyelids 03/19/2018   Blepharitis of upper and lower eyelids of both eyes 03/19/2018   Dry eye syndrome of both lacrimal glands 03/19/2018   Hypertensive retinopathy of both eyes 03/19/2018   Meibomian gland dysfunction (MGD) of both eyes 03/19/2018   Allergic rhinitis 01/25/2018   Bulging discs 01/25/2018   Endometriosis 01/25/2018   Acquired hypothyroidism 04/21/2017   Complex tear of medial meniscus of right knee as current injury 02/07/2016   Recurrent UTI 03/28/2011   Hematuria of undiagnosed cause 01/31/2011    Past Surgical  History:  Procedure Laterality Date   BUNIONECTOMY  10-04-2010   LEFT FOOT   BUNIONECTOMY  01/18/2011   Procedure: Arbutus Leas;  Surgeon: Illene Labrador [place];  Location: [facility];  Service: Orthopedics;  Laterality: Right;   EXCISION MORTON'S NEUROMA  10-04-2010   LEFT --  THIRD- [facility]   REMOVAL BAKER'S CYST RIGHT KNEE  6 YRS AGO    OB History   No obstetric history on file.      Home Medications    Prior to Admission medications   Medication Sig Start Date End Date Taking? Authorizing Provider  ACIDOPHILUS LACTOBACILLUS PO Take by mouth.    [provider]  APIXABAN Everlene Balls) VTE STARTER PACK (10MG  AND 5MG ) Take as directed on package: start with two-5mg  tablets twice daily for 7 days. On day 8, switch to one-5mg  tablet twice daily. 11/14/19   Lurene Shadow, PA-C  atorvastatin (LIPITOR) 20 MG tablet Take 1 tablet (20 mg total) by mouth daily. 09/22/19   Sunnie Nielsen, DO  b complex vitamins capsule Take by mouth.    [provider]  cetirizine-pseudoephedrine (ZYRTEC-D) 5-120 MG tablet Take 1 tablet by mouth 2 (two) times daily. 01/10/22   Eustace Moore, MD  Cholecalciferol 1.25 MG (50000 UT) TABS Take by mouth.    [provider]  clobetasol cream (TEMOVATE) 0.05 % Use as directed bid 11/20/16   [provider]  metoprolol tartrate (LOPRESSOR) 50 MG tablet Take 50 mg by  mouth 2 (two) times daily.    [provider]  Multiple Vitamins-Minerals (MULTI-VITAMIN MENOPAUSAL) TABS Take 2 tablets by mouth once.      [provider]  NP THYROID 30 MG tablet Take 30 mg by mouth daily. 08/06/19   [provider]  Nutritional Supplements (MELATONIN PO) Take by mouth as needed.      [provider]  Olopatadine HCl (PAZEO) 0.7 % SOLN Apply 1 drop to eye daily. 04/06/22   Crain, Whitney L, PA  PREBIOTIC PRODUCT PO Take by mouth.    [provider]  Pregnenolone POWD by Does not  apply route.    [provider]  PREMARIN vaginal cream Place vaginally. 04/01/19   [provider]    Family History Family History  Problem Relation Age of Onset   COPD Mother    Heart failure Mother    Diabetes Father    Hypertension Father    Heart failure Father     Social History Social History   Tobacco Use   Smoking status: Former    Types: Cigarettes    Quit date: 01/11/2006    Years since quitting: 16.4   Smokeless tobacco: Never  Vaping Use   Vaping Use: Never used  Substance Use Topics   Alcohol use: Yes    Comment: occ   Drug use: No     Allergies   Latex, Nitrofurantoin macrocrystal, Sulfamethoxazole, Tape, Ibuprofen, and Penicillins   Review of Systems Review of Systems As per HPI  Physical Exam Triage Vital Signs ED Triage Vitals  Enc Vitals Group     BP 06/18/22 1302 125/81     Pulse Rate 06/18/22 1302 82     Resp 06/18/22 1302 14     Temp 06/18/22 1302 97.8 F (36.6 C)     Temp Source 06/18/22 1302 Oral     SpO2 06/18/22 1302 97 %     Weight 06/18/22 1300 162 lb (73.5 kg)     Height --      Head Circumference --      Peak Flow --      Pain Score 06/18/22 1300 0     Pain Loc --      Pain Edu? --      Excl. in GC? --    No data found.  Updated Vital Signs BP 125/81 (BP Location: Left Arm)   Pulse 82   Temp 97.8 F (36.6 C) (Oral)   Resp 14   Wt 73.5 kg   SpO2 97%   BMI 26.96 kg/m   Visual Acuity Right Eye Distance:   Left Eye Distance:   Bilateral Distance:    Right Eye Near:   Left Eye Near:    Bilateral Near:     Physical Exam Vitals and nursing note reviewed.  Constitutional:      General: She is not in acute distress.    Appearance: She is not ill-appearing.  Cardiovascular:     Rate and Rhythm: Normal rate and regular rhythm.     Pulses: Normal pulses.     Heart sounds: Normal heart sounds.  Pulmonary:     Effort: Pulmonary effort is normal.     Breath sounds: Normal breath sounds.   Abdominal:     General: Bowel sounds are normal.     Palpations: Abdomen is soft.  Neurological:     Mental Status: She is alert.      UC Treatments / Results  Labs (all labs ordered  are listed, but only abnormal results are displayed) Labs Reviewed  POCT URINALYSIS DIP (MANUAL ENTRY) - Abnormal; Notable for the following components:      Result Value   Clarity, UA cloudy (*)    Blood, UA trace-intact (*)    Leukocytes, UA Small (1+) (*)    All other components within normal limits    EKG   Radiology No results found.  Procedures Procedures (including critical care time)  Medications Ordered in UC Medications - No data to display  Initial Impression / Assessment and Plan / UC Course  I have reviewed the triage vital signs and the nursing notes.  Pertinent labs & imaging results that were available during my care of the patient were reviewed by me and considered in my medical decision making (see chart for details).     1.  Dysuria: Point-of-care urinalysis is positive for trace blood and small leukocyte Estrace Urine cultures will be sent No indication for antibiotics given the recent completion of the course of antibiotics We will call patient with recommendations if labs are abnormal Patient is advised to increase oral fluid intake Return precautions given. Final Clinical Impressions(s) / UC Diagnoses   Final diagnoses:  Dysuria     Discharge Instructions      Please increase oral fluid intake Your urine is not remarkable for urinary tract infection I will send a urine culture If the urine culture is abnormal, we will give you a call Please take Pyridium and continue using other medications for lichens sclerosis Please keep your appointment with the urologist. There is no indication for antibiotics at this time.   ED Prescriptions   None    PDMP not reviewed this encounter.   Merrilee Jansky, MD 06/18/22 [phone]

## 2022-06-18 NOTE — ED Triage Notes (Addendum)
C/o dysuria  Hx of dysuria & UTI's in the past  Pt has diarrhea when she has a UTI  Pt has an appt with a urologist in July Previous urologist retired - pt states she normally has to do 14 days of antibiotics

## 2022-06-18 NOTE — Discharge Instructions (Signed)
Please increase oral fluid intake Your urine is not remarkable for urinary tract infection I will send a urine culture If the urine culture is abnormal, we will give you a call Please take Pyridium and continue using other medications for lichens sclerosis Please keep your appointment with the urologist. There is no indication for antibiotics at this time.
